# Patient Record
Sex: Female | Born: 1990 | Hispanic: Yes | Marital: Married | Smoking: Former smoker
Health system: Southern US, Community
[De-identification: ages and names within clinical notes are randomized; demographics above are authoritative.]

## PROBLEM LIST (undated history)

## (undated) ENCOUNTER — Inpatient Hospital Stay (HOSPITAL_COMMUNITY): Payer: Self-pay

## (undated) DIAGNOSIS — F32A Depression, unspecified: Secondary | ICD-10-CM

## (undated) DIAGNOSIS — D649 Anemia, unspecified: Secondary | ICD-10-CM

## (undated) HISTORY — PX: NO PAST SURGERIES: SHX2092

---

## 2012-01-14 ENCOUNTER — Emergency Department (HOSPITAL_COMMUNITY): Payer: Self-pay

## 2012-01-14 ENCOUNTER — Emergency Department (HOSPITAL_COMMUNITY)
Admission: EM | Admit: 2012-01-14 | Discharge: 2012-01-14 | Disposition: A | Discharge status: Home / Self Care | Payer: No Typology Code available for payment source | Attending: Emergency Medicine | Admitting: Emergency Medicine

## 2012-01-14 ENCOUNTER — Encounter (HOSPITAL_COMMUNITY): Payer: Self-pay

## 2012-01-14 DIAGNOSIS — R079 Chest pain, unspecified: Secondary | ICD-10-CM | POA: Insufficient documentation

## 2012-01-14 DIAGNOSIS — R51 Headache: Secondary | ICD-10-CM | POA: Insufficient documentation

## 2012-01-14 DIAGNOSIS — M542 Cervicalgia: Secondary | ICD-10-CM | POA: Insufficient documentation

## 2012-01-14 DIAGNOSIS — S139XXA Sprain of joints and ligaments of unspecified parts of neck, initial encounter: Secondary | ICD-10-CM | POA: Insufficient documentation

## 2012-01-14 DIAGNOSIS — M25519 Pain in unspecified shoulder: Secondary | ICD-10-CM | POA: Insufficient documentation

## 2012-01-14 DIAGNOSIS — N949 Unspecified condition associated with female genital organs and menstrual cycle: Secondary | ICD-10-CM | POA: Insufficient documentation

## 2012-01-14 DIAGNOSIS — M545 Low back pain, unspecified: Secondary | ICD-10-CM | POA: Insufficient documentation

## 2012-01-14 DIAGNOSIS — S161XXA Strain of muscle, fascia and tendon at neck level, initial encounter: Secondary | ICD-10-CM

## 2012-01-14 DIAGNOSIS — M546 Pain in thoracic spine: Secondary | ICD-10-CM | POA: Insufficient documentation

## 2012-01-14 MED ORDER — OXYCODONE-ACETAMINOPHEN 5-325 MG PO TABS: 1.0000 | ORAL_TABLET | Freq: Four times a day (QID) | ORAL | Status: AC | PRN

## 2012-01-14 MED ORDER — MORPHINE SULFATE 4 MG/ML IJ SOLN
4.0000 mg | Freq: Once | INTRAMUSCULAR | Status: AC
  Administered 2012-01-14: 4 mg via INTRAMUSCULAR
  Filled 2012-01-14: qty 1
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
## 2012-01-14 NOTE — ED Notes (Signed)
Family states she has not eaten at all today.
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
## 2015-07-24 ENCOUNTER — Other Ambulatory Visit (HOSPITAL_COMMUNITY)
Admission: RE | Admit: 2015-07-24 | Discharge: 2015-07-24 | Disposition: A | Discharge status: Home / Self Care | Payer: BLUE CROSS/BLUE SHIELD | Source: Ambulatory Visit | Attending: Obstetrics and Gynecology | Admitting: Obstetrics and Gynecology

## 2015-07-24 ENCOUNTER — Other Ambulatory Visit: Payer: Self-pay | Admitting: Obstetrics and Gynecology

## 2015-07-24 DIAGNOSIS — Z01419 Encounter for gynecological examination (general) (routine) without abnormal findings: Secondary | ICD-10-CM | POA: Diagnosis present

## 2015-07-24 DIAGNOSIS — Z113 Encounter for screening for infections with a predominantly sexual mode of transmission: Secondary | ICD-10-CM | POA: Insufficient documentation

## 2015-07-26 LAB — CYTOLOGY - PAP

## 2016-04-30 DIAGNOSIS — Z309 Encounter for contraceptive management, unspecified: Secondary | ICD-10-CM | POA: Diagnosis not present

## 2016-04-30 DIAGNOSIS — Z3202 Encounter for pregnancy test, result negative: Secondary | ICD-10-CM | POA: Diagnosis not present

## 2016-07-24 ENCOUNTER — Other Ambulatory Visit: Payer: Self-pay | Admitting: Obstetrics and Gynecology

## 2016-07-24 ENCOUNTER — Other Ambulatory Visit (HOSPITAL_COMMUNITY)
Admission: RE | Admit: 2016-07-24 | Discharge: 2016-07-24 | Disposition: A | Discharge status: Home / Self Care | Payer: BLUE CROSS/BLUE SHIELD | Source: Ambulatory Visit | Attending: Obstetrics and Gynecology | Admitting: Obstetrics and Gynecology

## 2016-07-24 DIAGNOSIS — Z01419 Encounter for gynecological examination (general) (routine) without abnormal findings: Secondary | ICD-10-CM | POA: Insufficient documentation

## 2016-07-26 LAB — CYTOLOGY - PAP

## 2016-09-16 DIAGNOSIS — L811 Chloasma: Secondary | ICD-10-CM | POA: Diagnosis not present

## 2016-10-23 DIAGNOSIS — Z309 Encounter for contraceptive management, unspecified: Secondary | ICD-10-CM | POA: Diagnosis not present

## 2016-11-26 DIAGNOSIS — Z3009 Encounter for other general counseling and advice on contraception: Secondary | ICD-10-CM | POA: Diagnosis not present

## 2016-11-26 DIAGNOSIS — N760 Acute vaginitis: Secondary | ICD-10-CM | POA: Diagnosis not present

## 2016-12-23 NOTE — L&D Delivery Note (Signed)
Delivery Note At 4:22 PM a viable female was delivered via Vaginal, Spontaneous (Presentation occiput anterior : ;  ).  APGAR: 9, 9; weight  .   Placenta status: ,intact 3 vesse  Cord:  with the following complications: None.  Cord pH: NA  Anesthesia:epidural    Episiotomy: None Lacerations: 2nd degree and Right labial  Suture Repair: 3.0 vicryl Est. Blood Loss (mL):  400 mL Uterine atony noted. Controlled with pitocin IV and 1000 mcg of cytotec placed rectally   Mom to postpartum.  Baby to Couplet care / Skin to Skin.  Muaad Boehning J. 12/12/2017, 4:46 PM

## 2017-01-14 DIAGNOSIS — N760 Acute vaginitis: Secondary | ICD-10-CM | POA: Diagnosis not present

## 2017-01-14 DIAGNOSIS — Z3169 Encounter for other general counseling and advice on procreation: Secondary | ICD-10-CM | POA: Diagnosis not present

## 2017-02-03 DIAGNOSIS — Z3200 Encounter for pregnancy test, result unknown: Secondary | ICD-10-CM | POA: Diagnosis not present

## 2017-04-14 ENCOUNTER — Ambulatory Visit (HOSPITAL_COMMUNITY)
Admission: EM | Admit: 2017-04-14 | Discharge: 2017-04-14 | Disposition: A | Discharge status: Home / Self Care | Payer: BLUE CROSS/BLUE SHIELD | Attending: Family Medicine | Admitting: Family Medicine

## 2017-04-14 ENCOUNTER — Encounter (HOSPITAL_COMMUNITY): Payer: Self-pay | Admitting: Emergency Medicine

## 2017-04-14 DIAGNOSIS — N926 Irregular menstruation, unspecified: Secondary | ICD-10-CM | POA: Diagnosis not present

## 2017-04-14 DIAGNOSIS — Z3201 Encounter for pregnancy test, result positive: Secondary | ICD-10-CM | POA: Diagnosis not present

## 2017-04-14 DIAGNOSIS — Z3490 Encounter for supervision of normal pregnancy, unspecified, unspecified trimester: Secondary | ICD-10-CM

## 2017-04-14 DIAGNOSIS — N912 Amenorrhea, unspecified: Secondary | ICD-10-CM

## 2017-04-14 LAB — POCT PREGNANCY, URINE: PREG TEST UR: POSITIVE — AB

## 2017-04-14 NOTE — ED Triage Notes (Signed)
The patient presented to the [facility] with a complaint of missing her period and requesting a pregnancy test. The patient reported a positive home pregnancy test 3 days ago.

## 2017-04-14 NOTE — Discharge Instructions (Signed)
You are pregnant..  Follow up with your doctor.

## 2017-04-14 NOTE — ED Provider Notes (Signed)
[facility]    CSN: [id number] Arrival date & time: 04/14/17  1724     History   Chief Complaint Chief Complaint  Patient presents with  . Possible Pregnancy    HPI Brittney Olson is a 26 y.o. female.   The patient presented to the [facility] with a complaint of missing her period and requesting a pregnancy test. The patient reported a positive home pregnancy test 3 days ago.  Last menstrual period was March 20.  Patient is G0P0      History reviewed. No pertinent past medical history.  There are no active problems to display for this patient.   History reviewed. No pertinent surgical history.  OB History    No data available       Home Medications    Prior to Admission medications   Not on File    Family History History reviewed. No pertinent family history.  Social History Social History  Substance Use Topics  . Smoking status: Never Smoker  . Smokeless tobacco: Not on file  . Alcohol use No     Allergies   Patient has no known allergies.   Review of Systems Review of Systems  All other systems reviewed and are negative.    Physical Exam Triage Vital Signs ED Triage Vitals  Enc Vitals Group     BP 04/14/17 1741 109/71     Pulse Rate 04/14/17 1741 80     Resp 04/14/17 1741 16     Temp 04/14/17 1741 97.7 F (36.5 C)     Temp Source 04/14/17 1741 Oral     SpO2 04/14/17 1741 95 %     Weight --      Height --      Head Circumference --      Peak Flow --      Pain Score 04/14/17 1740 0     Pain Loc --      Pain Edu? --      Excl. in GC? --    No data found.   Updated Vital Signs BP 109/71 (BP Location: Right Arm)   Pulse 80   Temp 97.7 F (36.5 C) (Oral)   Resp 16   LMP 03/11/2017 Comment: Needs pregnancy test  SpO2 95%    Physical Exam  Constitutional: She is oriented to person, place, and time. She appears well-developed and well-nourished.  HENT:  Right Ear: External ear normal.  Left Ear: External  ear normal.  Mouth/Throat: Oropharynx is clear and moist.  Eyes: Conjunctivae and EOM are normal. Pupils are equal, round, and reactive to light.  Neck: Normal range of motion. Neck supple.  Pulmonary/Chest: Effort normal.  Musculoskeletal: Normal range of motion.  Neurological: She is alert and oriented to person, place, and time.  Skin: Skin is warm and dry.  Nursing note and vitals reviewed.    UC Treatments / Results  Labs (all labs ordered are listed, but only abnormal results are displayed) Labs Reviewed  POCT PREGNANCY, URINE    EKG  EKG Interpretation None       Radiology No results found.  Procedures Procedures (including critical care time)  Medications Ordered in UC Medications - No data to display   Initial Impression / Assessment and Plan / UC Course  I have reviewed the triage vital signs and the nursing notes.  Pertinent labs & imaging results that were available during my care of the patient were reviewed by me and considered in my medical decision  making (see chart for details).      Final Clinical Impressions(s) / UC Diagnoses   Final diagnoses:  Amenorrhea  Pregnancy, unspecified gestational age    [facility] Prescriptions New Prescriptions   No medications on file     Elvina Sidle, MD 04/14/17 1811

## 2017-05-01 DIAGNOSIS — Z3401 Encounter for supervision of normal first pregnancy, first trimester: Secondary | ICD-10-CM | POA: Diagnosis not present

## 2017-05-01 DIAGNOSIS — Z3400 Encounter for supervision of normal first pregnancy, unspecified trimester: Secondary | ICD-10-CM | POA: Diagnosis not present

## 2017-05-01 LAB — OB RESULTS CONSOLE GC/CHLAMYDIA
CHLAMYDIA, DNA PROBE: NEGATIVE
Gonorrhea: NEGATIVE

## 2017-05-01 LAB — OB RESULTS CONSOLE HIV ANTIBODY (ROUTINE TESTING): HIV: NONREACTIVE

## 2017-05-01 LAB — OB RESULTS CONSOLE ABO/RH: RH TYPE: POSITIVE

## 2017-05-01 LAB — OB RESULTS CONSOLE RPR: RPR: NONREACTIVE

## 2017-05-01 LAB — OB RESULTS CONSOLE HEPATITIS B SURFACE ANTIGEN: Hepatitis B Surface Ag: NEGATIVE

## 2017-05-01 LAB — OB RESULTS CONSOLE RUBELLA ANTIBODY, IGM: Rubella: IMMUNE

## 2017-06-11 DIAGNOSIS — Z3401 Encounter for supervision of normal first pregnancy, first trimester: Secondary | ICD-10-CM | POA: Diagnosis not present

## 2017-06-11 DIAGNOSIS — Z3400 Encounter for supervision of normal first pregnancy, unspecified trimester: Secondary | ICD-10-CM | POA: Diagnosis not present

## 2017-07-22 DIAGNOSIS — R300 Dysuria: Secondary | ICD-10-CM | POA: Diagnosis not present

## 2017-07-23 DIAGNOSIS — Z3402 Encounter for supervision of normal first pregnancy, second trimester: Secondary | ICD-10-CM | POA: Diagnosis not present

## 2017-07-23 DIAGNOSIS — Z3A19 19 weeks gestation of pregnancy: Secondary | ICD-10-CM | POA: Diagnosis not present

## 2017-07-23 DIAGNOSIS — Z36 Encounter for antenatal screening for chromosomal anomalies: Secondary | ICD-10-CM | POA: Diagnosis not present

## 2017-07-29 DIAGNOSIS — Z3402 Encounter for supervision of normal first pregnancy, second trimester: Secondary | ICD-10-CM | POA: Diagnosis not present

## 2017-09-24 DIAGNOSIS — Z3403 Encounter for supervision of normal first pregnancy, third trimester: Secondary | ICD-10-CM | POA: Diagnosis not present

## 2017-09-24 DIAGNOSIS — Z3402 Encounter for supervision of normal first pregnancy, second trimester: Secondary | ICD-10-CM | POA: Diagnosis not present

## 2017-10-02 ENCOUNTER — Inpatient Hospital Stay (HOSPITAL_COMMUNITY)
Admission: AD | Admit: 2017-10-02 | Discharge: 2017-10-02 | Disposition: A | Discharge status: Home / Self Care | Payer: BLUE CROSS/BLUE SHIELD | Source: Ambulatory Visit | Attending: Obstetrics and Gynecology | Admitting: Obstetrics and Gynecology

## 2017-10-02 ENCOUNTER — Encounter: Payer: Self-pay | Admitting: Student

## 2017-10-02 ENCOUNTER — Inpatient Hospital Stay (HOSPITAL_COMMUNITY): Payer: BLUE CROSS/BLUE SHIELD

## 2017-10-02 DIAGNOSIS — O4700 False labor before 37 completed weeks of gestation, unspecified trimester: Secondary | ICD-10-CM | POA: Diagnosis not present

## 2017-10-02 DIAGNOSIS — Z3A29 29 weeks gestation of pregnancy: Secondary | ICD-10-CM | POA: Insufficient documentation

## 2017-10-02 DIAGNOSIS — O368130 Decreased fetal movements, third trimester, not applicable or unspecified: Secondary | ICD-10-CM | POA: Diagnosis not present

## 2017-10-02 DIAGNOSIS — O479 False labor, unspecified: Secondary | ICD-10-CM | POA: Diagnosis not present

## 2017-10-02 DIAGNOSIS — Z3689 Encounter for other specified antenatal screening: Secondary | ICD-10-CM

## 2017-10-02 DIAGNOSIS — Z87891 Personal history of nicotine dependence: Secondary | ICD-10-CM | POA: Diagnosis not present

## 2017-10-02 HISTORY — DX: Anemia, unspecified: D64.9

## 2017-10-02 LAB — URINALYSIS, ROUTINE W REFLEX MICROSCOPIC
Bilirubin Urine: NEGATIVE
Glucose, UA: NEGATIVE mg/dL
Hgb urine dipstick: NEGATIVE
Ketones, ur: NEGATIVE mg/dL
Nitrite: NEGATIVE
PROTEIN: NEGATIVE mg/dL
Specific Gravity, Urine: 1.008 (ref 1.005–1.030)
pH: 8 (ref 5.0–8.0)

## 2017-10-02 LAB — FETAL FIBRONECTIN: FETAL FIBRONECTIN: NEGATIVE

## 2017-10-02 MED ORDER — LACTATED RINGERS IV BOLUS (SEPSIS)
1000.0000 mL | Freq: Once | INTRAVENOUS | Status: AC
  Administered 2017-10-02: 1000 mL via INTRAVENOUS

## 2017-10-02 NOTE — Discharge Instructions (Signed)
Preterm Labor and Birth Information °The normal length of a pregnancy is 39-41 weeks. Preterm labor is when labor starts before 37 completed weeks of pregnancy. °What are the risk factors for preterm labor? °Preterm labor is more likely to occur in women who: °· Have certain infections during pregnancy such as a bladder infection, sexually transmitted infection, or infection inside the uterus (chorioamnionitis). °· Have a shorter-than-normal cervix. °· Have gone into preterm labor before. °· Have had surgery on their cervix. °· Are younger than age 17 or older than age 35. °· Are African American. °· Are pregnant with twins or multiple babies (multiple gestation). °· Take street drugs or smoke while pregnant. °· Do not gain enough weight while pregnant. °· Became pregnant shortly after having been pregnant. ° °What are the symptoms of preterm labor? °Symptoms of preterm labor include: °· Cramps similar to those that can happen during a menstrual period. The cramps may happen with diarrhea. °· Pain in the abdomen or lower back. °· Regular uterine contractions that may feel like tightening of the abdomen. °· A feeling of increased pressure in the pelvis. °· Increased watery or bloody mucus discharge from the vagina. °· Water breaking (ruptured amniotic sac). ° °Why is it important to recognize signs of preterm labor? °It is important to recognize signs of preterm labor because babies who are born prematurely may not be fully developed. This can put them at an increased risk for: °· Long-term (chronic) heart and lung problems. °· Difficulty immediately after birth with regulating body systems, including blood sugar, body temperature, heart rate, and breathing rate. °· Bleeding in the brain. °· Cerebral palsy. °· Learning difficulties. °· Death. ° °These risks are highest for babies who are born before 34 weeks of pregnancy. °How is preterm labor treated? °Treatment depends on the length of your pregnancy, your  condition, and the health of your baby. It may involve: °· Having a stitch (suture) placed in your cervix to prevent your cervix from opening too early (cerclage). °· Taking or being given medicines, such as: °? Hormone medicines. These may be given early in pregnancy to help support the pregnancy. °? Medicine to stop contractions. °? Medicines to help mature the baby’s lungs. These may be prescribed if the risk of delivery is high. °? Medicines to prevent your baby from developing cerebral palsy. ° °If the labor happens before 34 weeks of pregnancy, you may need to stay in the hospital. °What should I do if I think I am in preterm labor? °If you think that you are going into preterm labor, call your health care provider right away. °How can I prevent preterm labor in future pregnancies? °To increase your chance of having a full-term pregnancy: °· Do not use any tobacco products, such as cigarettes, chewing tobacco, and e-cigarettes. If you need help quitting, ask your health care provider. °· Do not use street drugs or medicines that have not been prescribed to you during your pregnancy. °· Talk with your health care provider before taking any herbal supplements, even if you have been taking them regularly. °· Make sure you gain a healthy amount of weight during your pregnancy. °· Watch for infection. If you think that you might have an infection, get it checked right away. °· Make sure to tell your health care provider if you have gone into preterm labor before. ° °This information is not intended to replace advice given to you by your health care provider. Make sure you discuss any questions   you have with your health care provider. °Document Released: 02/29/2004 Document Revised: 05/21/2016 Document Reviewed: 05/01/2016 °Elsevier Interactive Patient Education © 2018 [employer] ° ° ° ° °Fetal Movement Counts °Patient Name: ________________________________________________ Patient Due Date:  ____________________ °What is a fetal movement count? °A fetal movement count is the number of times that you feel your baby move during a certain amount of time. This may also be called a fetal kick count. A fetal movement count is recommended for every pregnant woman. You may be asked to start counting fetal movements as early as week 28 of your pregnancy. °Pay attention to when your baby is most active. You may notice your baby's sleep and wake cycles. You may also notice things that make your baby move more. You should do a fetal movement count: °· When your baby is normally most active. °· At the same time each day. ° °A good time to count movements is while you are resting, after having something to eat and drink. °How do I count fetal movements? °1. Find a quiet, comfortable area. Sit, or lie down on your side. °2. Write down the date, the start time and stop time, and the number of movements that you felt between those two times. Take this information with you to your health care visits. °3. For 2 hours, count kicks, flutters, swishes, rolls, and jabs. You should feel at least 10 movements during 2 hours. °4. You may stop counting after you have felt 10 movements. °5. If you do not feel 10 movements in 2 hours, have something to eat and drink. Then, keep resting and counting for 1 hour. If you feel at least 4 movements during that hour, you may stop counting. °Contact a health care provider if: °· You feel fewer than 4 movements in 2 hours. °· Your baby is not moving like he or she usually does. °Date: ____________ Start time: ____________ Stop time: ____________ Movements: ____________ °Date: ____________ Start time: ____________ Stop time: ____________ Movements: ____________ °Date: ____________ Start time: ____________ Stop time: ____________ Movements: ____________ °Date: ____________ Start time: ____________ Stop time: ____________ Movements: ____________ °Date: ____________ Start time: ____________  Stop time: ____________ Movements: ____________ °Date: ____________ Start time: ____________ Stop time: ____________ Movements: ____________ °Date: ____________ Start time: ____________ Stop time: ____________ Movements: ____________ °Date: ____________ Start time: ____________ Stop time: ____________ Movements: ____________ °Date: ____________ Start time: ____________ Stop time: ____________ Movements: ____________ °This information is not intended to replace advice given to you by your health care provider. Make sure you discuss any questions you have with your health care provider. °Document Released: 01/08/2007 Document Revised: 08/07/2016 Document Reviewed: 01/18/2016 °Elsevier Interactive Patient Education © 2018 [employer] Inc[employer] ° °

## 2017-10-02 NOTE — MAU Provider Note (Signed)
History     CSN: [id number]  Arrival date and time: 10/02/17 1410   First Provider Initiated Contact with Patient 10/02/17 1455      Chief Complaint  Patient presents with  . Decreased Fetal Movement   HPI Brittney Olson is a 26 y.o. G1P0 at [redacted]w[redacted]d who presents with decreased fetal movement since yesterday. Reports only feeling baby move 4 times today prior to coming to [facility]. Since arriving to [facility] has felt baby move more. Denies abdominal pain, vaginal bleeding, or LOF. Denies complications with this pregnancy.   OB History    Gravida Para Term Preterm AB Living   1             SAB TAB Ectopic Multiple Live Births                  Past Medical History:  Diagnosis Date  . Anemia     History reviewed. No pertinent surgical history.  Family History  Problem Relation Age of Onset  . Cancer Maternal Grandfather     Social History  Substance Use Topics  . Smoking status: Former Smoker    Quit date: 10/02/2013  . Smokeless tobacco: Never Used     Comment: smoked socially   . Alcohol use No    Allergies: No Known Allergies  No prescriptions prior to admission.    Review of Systems  Constitutional: Negative.   Gastrointestinal: Negative.   Genitourinary: Negative.    Physical Exam   Blood pressure 104/67, pulse 78, temperature 98.4 F (36.9 C), temperature source Oral, resp. rate 18, weight 130 lb (59 kg), last menstrual period 03/11/2017, SpO2 97 %.  Physical Exam  Nursing note and vitals reviewed. Constitutional: She is oriented to person, place, and time. She appears well-developed and well-nourished. No distress.  HENT:  Head: Normocephalic and atraumatic.  Eyes: Conjunctivae are normal. Right eye exhibits no discharge. Left eye exhibits no discharge. No scleral icterus.  Neck: Normal range of motion.  Cardiovascular:  No murmur heard. Respiratory: Effort normal. No respiratory distress.  GI: Soft. There is no tenderness.  Genitourinary:   Genitourinary Comments: Dilation: Closed Effacement (%): Thick Cervical Position: Middle Station: -3 Exam by:: Judeth Horn NP   Neurological: She is alert and oriented to person, place, and time.  Skin: Skin is warm and dry. She is not diaphoretic.  Psychiatric: She has a normal mood and affect. Her behavior is normal. Judgment and thought content normal.   Fetal Tracing:  Baseline: 145 Variability: moderate Accelerations: 15x15 Decelerations: none  Toco: Q2-5 min & some UI -- improved with IV fluids MAU Course  Procedures Results for orders placed or performed during the hospital encounter of 10/02/17 (from the past 24 hour(s))  Fetal fibronectin     Status: None   Collection Time: 10/02/17  3:28 PM  Result Value Ref Range   Fetal Fibronectin NEGATIVE NEGATIVE  Urinalysis, Routine w reflex microscopic     Status: Abnormal   Collection Time: 10/02/17  3:31 PM  Result Value Ref Range   Color, Urine YELLOW YELLOW   APPearance HAZY (A) CLEAR   Specific Gravity, Urine 1.008 1.005 - 1.030   pH 8.0 5.0 - 8.0   Glucose, UA NEGATIVE NEGATIVE mg/dL   Hgb urine dipstick NEGATIVE NEGATIVE   Bilirubin Urine NEGATIVE NEGATIVE   Ketones, ur NEGATIVE NEGATIVE mg/dL   Protein, ur NEGATIVE NEGATIVE mg/dL   Nitrite NEGATIVE NEGATIVE   Leukocytes, UA TRACE (A) NEGATIVE   RBC /  HPF 0-5 0 - 5 RBC/hpf   WBC, UA 0-5 0 - 5 WBC/hpf   Bacteria, UA RARE (A) NONE SEEN   Squamous Epithelial / LPF 0-5 (A) NONE SEEN   Mucus PRESENT    No results found.  MDM Reactive NST. Fetal movement audible in room & pt reports return of perceived movement.  Ctx every 3-5 minutes. Palpate mild. Patient denies pain; reports feeling tightening. Cervix closed/thick/high. FFN negative.  IV fluid bolus given & ultrasound ordered for cervical length per Dr. Dion Body.  TVUS -- CL 4.1 cm C/w Dr. Dion Body. Discussed results of FFN, ultrasound, & resolution of ctx. Ok to discharge home.   Assessment and Plan   A:  1. Decreased fetal movements in third trimester, single or unspecified fetus   2. Preterm contractions   3. [redacted] weeks gestation of pregnancy   4. NST (non-stress test) reactive    P: Discharge home Note for work today & tomorrow Increase water intake Discussed reasons to return to [facility]U Keep f/u with OB  Judeth Horn 10/02/2017, 7:17 PM

## 2017-10-02 NOTE — MAU Note (Signed)
Pt states that her baby is usually very active throughout the day. However, she noticed that last night and this morning that the baby was not moving. She states that she tried drinking, eating, and regular coffee today to see if she could get her baby to move. She reports that none of these interventions made her baby move. She completed a kick count at home starting at 1300 and had 4 movements in one hour.   She reports no bleeding, leaking fluid, or pain.

## 2017-10-09 DIAGNOSIS — Z23 Encounter for immunization: Secondary | ICD-10-CM | POA: Diagnosis not present

## 2017-10-09 DIAGNOSIS — N898 Other specified noninflammatory disorders of vagina: Secondary | ICD-10-CM | POA: Diagnosis not present

## 2017-10-23 DIAGNOSIS — O2613 Low weight gain in pregnancy, third trimester: Secondary | ICD-10-CM | POA: Diagnosis not present

## 2017-11-06 DIAGNOSIS — R800 Isolated proteinuria: Secondary | ICD-10-CM | POA: Diagnosis not present

## 2017-11-06 DIAGNOSIS — Z3403 Encounter for supervision of normal first pregnancy, third trimester: Secondary | ICD-10-CM | POA: Diagnosis not present

## 2017-11-25 DIAGNOSIS — Z3403 Encounter for supervision of normal first pregnancy, third trimester: Secondary | ICD-10-CM | POA: Diagnosis not present

## 2017-11-25 LAB — OB RESULTS CONSOLE GBS: STREP GROUP B AG: NEGATIVE

## 2017-12-11 ENCOUNTER — Encounter (HOSPITAL_COMMUNITY): Payer: Self-pay | Admitting: *Deleted

## 2017-12-11 ENCOUNTER — Inpatient Hospital Stay (HOSPITAL_COMMUNITY)
Admission: AD | Admit: 2017-12-11 | Discharge: 2017-12-11 | Disposition: A | Discharge status: Home / Self Care | Payer: BLUE CROSS/BLUE SHIELD | Source: Ambulatory Visit | Attending: Obstetrics and Gynecology | Admitting: Obstetrics and Gynecology

## 2017-12-11 DIAGNOSIS — D649 Anemia, unspecified: Secondary | ICD-10-CM | POA: Diagnosis not present

## 2017-12-11 DIAGNOSIS — Z8279 Family history of other congenital malformations, deformations and chromosomal abnormalities: Secondary | ICD-10-CM | POA: Diagnosis not present

## 2017-12-11 DIAGNOSIS — O471 False labor at or after 37 completed weeks of gestation: Secondary | ICD-10-CM

## 2017-12-11 DIAGNOSIS — Z3A39 39 weeks gestation of pregnancy: Secondary | ICD-10-CM | POA: Diagnosis not present

## 2017-12-11 DIAGNOSIS — Z87891 Personal history of nicotine dependence: Secondary | ICD-10-CM | POA: Diagnosis not present

## 2017-12-11 DIAGNOSIS — O9902 Anemia complicating childbirth: Secondary | ICD-10-CM | POA: Diagnosis not present

## 2017-12-11 DIAGNOSIS — Z23 Encounter for immunization: Secondary | ICD-10-CM | POA: Diagnosis not present

## 2017-12-11 DIAGNOSIS — O411230 Chorioamnionitis, third trimester, not applicable or unspecified: Secondary | ICD-10-CM | POA: Diagnosis not present

## 2017-12-11 DIAGNOSIS — Z051 Observation and evaluation of newborn for suspected infectious condition ruled out: Secondary | ICD-10-CM | POA: Diagnosis not present

## 2017-12-11 DIAGNOSIS — Z3483 Encounter for supervision of other normal pregnancy, third trimester: Secondary | ICD-10-CM | POA: Diagnosis not present

## 2017-12-11 NOTE — MAU Note (Signed)
1322 dr Charlotta Newtonozan notified of pt complaint of vaginal bleeding and contractions. Notified pt is having irregular contractions, SVE 1/50/-2/vtx with brown mucous discharge on examiner's glove. No bleeding on perineum, reactive EFM tracing , - GBS. EFM tracing reviewed per provider.

## 2017-12-11 NOTE — MAU Note (Signed)
Pt reports contractions and vaginal bleeding since last pm.

## 2017-12-12 ENCOUNTER — Encounter (HOSPITAL_COMMUNITY): Payer: Self-pay

## 2017-12-12 ENCOUNTER — Inpatient Hospital Stay (HOSPITAL_COMMUNITY): Payer: BLUE CROSS/BLUE SHIELD | Admitting: Anesthesiology

## 2017-12-12 ENCOUNTER — Inpatient Hospital Stay (HOSPITAL_COMMUNITY)
Admission: AD | Admit: 2017-12-12 | Discharge: 2017-12-14 | DRG: 807 | Disposition: A | Discharge status: Home / Self Care | Payer: BLUE CROSS/BLUE SHIELD | Source: Ambulatory Visit | Attending: Obstetrics and Gynecology | Admitting: Obstetrics and Gynecology

## 2017-12-12 DIAGNOSIS — Z87891 Personal history of nicotine dependence: Secondary | ICD-10-CM | POA: Diagnosis not present

## 2017-12-12 DIAGNOSIS — D649 Anemia, unspecified: Secondary | ICD-10-CM | POA: Diagnosis not present

## 2017-12-12 DIAGNOSIS — Z3A39 39 weeks gestation of pregnancy: Secondary | ICD-10-CM

## 2017-12-12 DIAGNOSIS — O411230 Chorioamnionitis, third trimester, not applicable or unspecified: Principal | ICD-10-CM | POA: Diagnosis present

## 2017-12-12 DIAGNOSIS — Z8279 Family history of other congenital malformations, deformations and chromosomal abnormalities: Secondary | ICD-10-CM | POA: Diagnosis not present

## 2017-12-12 DIAGNOSIS — O9902 Anemia complicating childbirth: Secondary | ICD-10-CM | POA: Diagnosis not present

## 2017-12-12 DIAGNOSIS — Z051 Observation and evaluation of newborn for suspected infectious condition ruled out: Secondary | ICD-10-CM | POA: Diagnosis not present

## 2017-12-12 DIAGNOSIS — Z3483 Encounter for supervision of other normal pregnancy, third trimester: Secondary | ICD-10-CM | POA: Diagnosis present

## 2017-12-12 LAB — CBC
HCT: 37.4 % (ref 36.0–46.0)
HEMOGLOBIN: 12.6 g/dL (ref 12.0–15.0)
MCH: 27.2 pg (ref 26.0–34.0)
MCHC: 33.7 g/dL (ref 30.0–36.0)
MCV: 80.6 fL (ref 78.0–100.0)
PLATELETS: 299 10*3/uL (ref 150–400)
RBC: 4.64 MIL/uL (ref 3.87–5.11)
RDW: 14.1 % (ref 11.5–15.5)
WBC: 15.8 10*3/uL — AB (ref 4.0–10.5)

## 2017-12-12 LAB — TYPE AND SCREEN
ABO/RH(D): B POS
ANTIBODY SCREEN: NEGATIVE

## 2017-12-12 LAB — RPR: RPR: NONREACTIVE

## 2017-12-12 LAB — ABO/RH: ABO/RH(D): B POS

## 2017-12-12 MED ORDER — SIMETHICONE 80 MG PO CHEW: 80.0000 mg | CHEWABLE_TABLET | ORAL | Status: DC | PRN

## 2017-12-12 MED ORDER — SODIUM CHLORIDE 0.9 % IV SOLN
3.0000 g | Freq: Four times a day (QID) | INTRAVENOUS | Status: AC
  Administered 2017-12-12 – 2017-12-13 (×4): 3 g via INTRAVENOUS
  Filled 2017-12-12 (×4): qty 3

## 2017-12-12 MED ORDER — BENZOCAINE-MENTHOL 20-0.5 % EX AERO: 1.0000 "application " | INHALATION_SPRAY | CUTANEOUS | Status: DC | PRN

## 2017-12-12 MED ORDER — ZOLPIDEM TARTRATE 5 MG PO TABS
5.0000 mg | ORAL_TABLET | Freq: Every evening | ORAL | Status: DC | PRN
Start: 2017-12-12 — End: 2017-12-14

## 2017-12-12 MED ORDER — DIPHENHYDRAMINE HCL 25 MG PO CAPS: 25.0000 mg | ORAL_CAPSULE | Freq: Four times a day (QID) | ORAL | Status: DC | PRN

## 2017-12-12 MED ORDER — WITCH HAZEL-GLYCERIN EX PADS: 1.0000 "application " | MEDICATED_PAD | CUTANEOUS | Status: DC | PRN

## 2017-12-12 MED ORDER — TERBUTALINE SULFATE 1 MG/ML IJ SOLN
0.2500 mg | Freq: Once | INTRAMUSCULAR | Status: DC | PRN
  Filled 2017-12-12: qty 1

## 2017-12-12 MED ORDER — METHYLERGONOVINE MALEATE 0.2 MG PO TABS: 0.2000 mg | ORAL_TABLET | ORAL | Status: DC | PRN

## 2017-12-12 MED ORDER — IBUPROFEN 600 MG PO TABS
600.0000 mg | ORAL_TABLET | Freq: Four times a day (QID) | ORAL | Status: DC
  Administered 2017-12-12 – 2017-12-14 (×8): 600 mg via ORAL
  Filled 2017-12-12 (×6): qty 1

## 2017-12-12 MED ORDER — LIDOCAINE HCL (PF) 1 % IJ SOLN
INTRAMUSCULAR | Status: DC | PRN
  Administered 2017-12-12 (×2): 5 mL

## 2017-12-12 MED ORDER — LACTATED RINGERS IV SOLN
500.0000 mL | Freq: Once | INTRAVENOUS | Status: AC
  Administered 2017-12-12: 500 mL via INTRAVENOUS

## 2017-12-12 MED ORDER — ONDANSETRON HCL 4 MG/2ML IJ SOLN: 4.0000 mg | INTRAMUSCULAR | Status: DC | PRN

## 2017-12-12 MED ORDER — LIDOCAINE HCL (PF) 1 % IJ SOLN
30.0000 mL | INTRAMUSCULAR | Status: DC | PRN
  Filled 2017-12-12: qty 30

## 2017-12-12 MED ORDER — PRENATAL MULTIVITAMIN CH
1.0000 | ORAL_TABLET | Freq: Every day | ORAL | Status: DC
  Administered 2017-12-13 – 2017-12-14 (×2): 1 via ORAL
  Filled 2017-12-12 (×2): qty 1

## 2017-12-12 MED ORDER — FERROUS SULFATE 325 (65 FE) MG PO TABS
325.0000 mg | ORAL_TABLET | Freq: Two times a day (BID) | ORAL | Status: DC
  Administered 2017-12-13 (×2): 325 mg via ORAL
  Filled 2017-12-12 (×2): qty 1

## 2017-12-12 MED ORDER — EPHEDRINE 5 MG/ML INJ
10.0000 mg | INTRAVENOUS | Status: DC | PRN
  Filled 2017-12-12: qty 2

## 2017-12-12 MED ORDER — OXYCODONE HCL 5 MG PO TABS: 5.0000 mg | ORAL_TABLET | ORAL | Status: DC | PRN

## 2017-12-12 MED ORDER — SOD CITRATE-CITRIC ACID 500-334 MG/5ML PO SOLN: 30.0000 mL | ORAL | Status: DC | PRN

## 2017-12-12 MED ORDER — LACTATED RINGERS IV SOLN: 500.0000 mL | INTRAVENOUS | Status: DC | PRN

## 2017-12-12 MED ORDER — METHYLERGONOVINE MALEATE 0.2 MG/ML IJ SOLN
INTRAMUSCULAR | Status: AC
  Filled 2017-12-12: qty 1

## 2017-12-12 MED ORDER — FLEET ENEMA 7-19 GM/118ML RE ENEM: 1.0000 | ENEMA | RECTAL | Status: DC | PRN

## 2017-12-12 MED ORDER — MISOPROSTOL 200 MCG PO TABS
ORAL_TABLET | ORAL | Status: AC
  Administered 2017-12-12: 1000 ug
  Filled 2017-12-12: qty 5

## 2017-12-12 MED ORDER — COCONUT OIL OIL
1.0000 "application " | TOPICAL_OIL | Status: DC | PRN
  Filled 2017-12-12: qty 120

## 2017-12-12 MED ORDER — ACETAMINOPHEN 325 MG PO TABS: 650.0000 mg | ORAL_TABLET | ORAL | Status: DC | PRN

## 2017-12-12 MED ORDER — LACTATED RINGERS IV SOLN: 500.0000 mL | Freq: Once | INTRAVENOUS | Status: DC

## 2017-12-12 MED ORDER — DIBUCAINE 1 % RE OINT: 1.0000 "application " | TOPICAL_OINTMENT | RECTAL | Status: DC | PRN

## 2017-12-12 MED ORDER — PHENYLEPHRINE 40 MCG/ML (10ML) SYRINGE FOR IV PUSH (FOR BLOOD PRESSURE SUPPORT)
80.0000 ug | PREFILLED_SYRINGE | INTRAVENOUS | Status: DC | PRN
  Filled 2017-12-12: qty 5

## 2017-12-12 MED ORDER — METHYLERGONOVINE MALEATE 0.2 MG/ML IJ SOLN: 0.2000 mg | INTRAMUSCULAR | Status: DC | PRN

## 2017-12-12 MED ORDER — OXYTOCIN 40 UNITS IN LACTATED RINGERS INFUSION - SIMPLE MED
1.0000 m[IU]/min | INTRAVENOUS | Status: DC
  Administered 2017-12-12: 2 m[IU]/min via INTRAVENOUS

## 2017-12-12 MED ORDER — FENTANYL 2.5 MCG/ML BUPIVACAINE 1/10 % EPIDURAL INFUSION (WH - ANES)
14.0000 mL/h | INTRAMUSCULAR | Status: DC | PRN
Start: 2017-12-12 — End: 2017-12-12
  Administered 2017-12-12 (×2): 14 mL/h via EPIDURAL
  Filled 2017-12-12 (×2): qty 100

## 2017-12-12 MED ORDER — MISOPROSTOL 200 MCG PO TABS: 1000.0000 ug | ORAL_TABLET | Freq: Once | ORAL | Status: DC

## 2017-12-12 MED ORDER — OXYTOCIN BOLUS FROM INFUSION
500.0000 mL | Freq: Once | INTRAVENOUS | Status: AC
  Administered 2017-12-12: 500 mL via INTRAVENOUS

## 2017-12-12 MED ORDER — ONDANSETRON HCL 4 MG/2ML IJ SOLN: 4.0000 mg | Freq: Four times a day (QID) | INTRAMUSCULAR | Status: DC | PRN

## 2017-12-12 MED ORDER — OXYCODONE-ACETAMINOPHEN 5-325 MG PO TABS: 2.0000 | ORAL_TABLET | ORAL | Status: DC | PRN

## 2017-12-12 MED ORDER — SENNOSIDES-DOCUSATE SODIUM 8.6-50 MG PO TABS
2.0000 | ORAL_TABLET | ORAL | Status: DC
  Administered 2017-12-12 – 2017-12-13 (×2): 2 via ORAL
  Filled 2017-12-12: qty 2

## 2017-12-12 MED ORDER — PHENYLEPHRINE 40 MCG/ML (10ML) SYRINGE FOR IV PUSH (FOR BLOOD PRESSURE SUPPORT)
80.0000 ug | PREFILLED_SYRINGE | INTRAVENOUS | Status: DC | PRN
  Filled 2017-12-12 (×2): qty 10
  Filled 2017-12-12: qty 5

## 2017-12-12 MED ORDER — OXYTOCIN 40 UNITS IN LACTATED RINGERS INFUSION - SIMPLE MED
2.5000 [IU]/h | INTRAVENOUS | Status: DC
  Filled 2017-12-12: qty 1000

## 2017-12-12 MED ORDER — ONDANSETRON HCL 4 MG PO TABS
4.0000 mg | ORAL_TABLET | ORAL | Status: DC | PRN
Start: 2017-12-12 — End: 2017-12-14

## 2017-12-12 MED ORDER — LACTATED RINGERS IV SOLN
INTRAVENOUS | Status: DC
  Administered 2017-12-12: 125 mL/h via INTRAVENOUS
  Administered 2017-12-12: 07:00:00 via INTRAVENOUS

## 2017-12-12 MED ORDER — DIPHENHYDRAMINE HCL 50 MG/ML IJ SOLN: 12.5000 mg | INTRAMUSCULAR | Status: DC | PRN

## 2017-12-12 MED ORDER — OXYCODONE-ACETAMINOPHEN 5-325 MG PO TABS: 1.0000 | ORAL_TABLET | ORAL | Status: DC | PRN

## 2017-12-12 NOTE — Anesthesia Preprocedure Evaluation (Addendum)
Anesthesia Evaluation  Patient identified by MRN, date of birth, ID band Patient awake    Reviewed: Allergy & Precautions, NPO status , Patient's Chart, lab work & pertinent test results  Airway Mallampati: II  TM Distance: >3 FB Neck ROM: Full    Dental no notable dental hx. (+) Teeth Intact   Pulmonary former smoker,    Pulmonary exam normal breath sounds clear to auscultation       Cardiovascular negative cardio ROS Normal cardiovascular exam Rhythm:Regular Rate:Normal     Neuro/Psych negative neurological ROS  negative psych ROS   GI/Hepatic negative GI ROS, Neg liver ROS,   Endo/Other  negative endocrine ROS  Renal/GU negative Renal ROS  negative genitourinary   Musculoskeletal negative musculoskeletal ROS (+)   Abdominal   Peds  Hematology  (+) anemia ,   Anesthesia Other Findings   Reproductive/Obstetrics                             Anesthesia Physical Anesthesia Plan  ASA: II  Anesthesia Plan: Epidural   Post-op Pain Management:    Induction:   PONV Risk Score and Plan:   Airway Management Planned:   Additional Equipment:   Intra-op Plan:   Post-operative Plan:   Informed Consent: I have reviewed the patients History and Physical, chart, labs and discussed the procedure including the risks, benefits and alternatives for the proposed anesthesia with the patient or authorized representative who has indicated his/her understanding and acceptance.     Plan Discussed with: Anesthesiologist  Anesthesia Plan Comments:         Anesthesia Quick Evaluation

## 2017-12-12 NOTE — Anesthesia Pain Management Evaluation Note (Signed)
  CRNA Pain Management Visit Note  Patient: Brittney Olson, 26 y.o., female  "Hello I am a [facility] of the anesthesia [facility]. We have an anesthesia team available at all times to provide care throughout the hospital, including epidural management and anesthesia for C-section. I don't know your plan for the delivery whether it a natural birth, water birth, IV sedation, nitrous supplementation, doula or epidural, but we want to meet your pain goals."   1.Was your pain managed to your expectations on prior hospitalizations?   No prior hospitalizations  2.What is your expectation for pain management during this hospitalization?     Epidural  3.How can we help you reach that goal? epidural  Record the patient's initial score and the patient's pain goal.   Pain: 0  Pain Goal: 4 [facility] wants you to be able to say your pain was always managed very well.  [name] 12/12/2017

## 2017-12-12 NOTE — Progress Notes (Signed)
Brittney Olson is a 26 y.o. G1P0 at 2463w3d by  admitted for active labor  Subjective:   Objective: BP 104/67   Pulse 76   Temp 98.4 F (36.9 C) (Oral)   Resp 18   Ht 5\' 1"  (1.549 m)   Wt 63 kg (139 lb)   LMP 03/11/2017 Comment: Needs pregnancy test  SpO2 98%   BMI 26.26 kg/m  No intake/output data recorded. No intake/output data recorded.  FHT:  FHR: 145 bpm, variability: moderate,  accelerations:  Present,  decelerations:  Absent UC:   regular, every 5 minutes SVE:   Dilation: 5 Effacement (%): 100 Station: -1 Exam by:: dr Richardson Doppcole  Labs: Lab Results  Component Value Date   WBC 15.8 (H) 12/12/2017   HGB 12.6 12/12/2017   HCT 37.4 12/12/2017   MCV 80.6 12/12/2017   PLT 299 12/12/2017    Assessment / Plan: Spontaneous labor, progressing normally  Labor: Progressing normally Preeclampsia:  NA Fetal Wellbeing:  Category I Pain Control:  Epidural I/D:  n/a Anticipated MOD:  NSVD  Brittney Urbani J. 12/12/2017, 12:01 PM

## 2017-12-12 NOTE — H&P (Signed)
Brittney Olson is a 26 y.o. female presenting for contractions.  Patient is under the care of  [facility] and is attended by Dr. Delrae Sawyers. Cole. Pregnancy and medical history has been unremarkable.  Patient states contractions started irregularly about 3 days ago and has been getting progressively worse.  Patient reports active fetus, some bloody show, and denies LOF or N/V.  OB History    Gravida Para Term Preterm AB Living   1             SAB TAB Ectopic Multiple Live Births                 Past Medical History:  Diagnosis Date  . Anemia    Past Surgical History:  Procedure Laterality Date  . NO PAST SURGERIES     Family History: family history includes Cancer in her maternal grandfather. Social History:  reports that she quit smoking about 4 years ago. she has never used smokeless tobacco. She reports that she does not drink alcohol or use drugs.     Maternal Diabetes: No Genetic Screening: Declined Maternal Ultrasounds/Referrals: Normal Fetal Ultrasounds or other Referrals:  None Maternal Substance Abuse:  No Significant Maternal Medications:  None Significant Maternal Lab Results:  Lab values include: Group B Strep negative Other Comments:  None  Review of Systems  Constitutional: Negative.   Eyes: Negative.   Gastrointestinal: Positive for abdominal pain (W/ctx). Negative for constipation, diarrhea, nausea and vomiting.   Maternal Medical History:  Reason for admission: Contractions.  Nausea.  Contractions: Onset was more than 2 days ago.   Frequency: irregular.   Perceived severity is moderate.    Fetal activity: Perceived fetal activity is normal.   Last perceived fetal movement was within the past hour.    Prenatal complications: no prenatal complications Prenatal Complications - Diabetes: none.    Dilation: 3 Effacement (%): 90 Station: -1 Exam by:: Camelia Enganielle Simpson RN Blood pressure 111/76, pulse 87, temperature 98 F (36.7 C), temperature  source Oral, resp. rate 18, height 5\' 1"  (1.549 m), weight 63 kg (139 lb), last menstrual period 03/11/2017, SpO2 98 %. Maternal Exam:  Uterine Assessment: Contraction strength is moderate.  Contraction frequency is irregular.   Abdomen: Patient reports no abdominal tenderness. Fundal height is AGA.   Estimated fetal weight is 6lbs.   Fetal presentation: vertex     Fetal Exam Fetal Monitor Review: Baseline rate: 150.  Variability: moderate (6-25 bpm).   Pattern: accelerations present and no decelerations.    Fetal State Assessment: Category I - tracings are normal.     Physical Exam  Vitals reviewed. Constitutional: She is oriented to person, place, and time. She appears well-developed and well-nourished. No distress.  HENT:  Head: Normocephalic and atraumatic.  Eyes: Conjunctivae are normal.  Neck: Normal range of motion.  Cardiovascular: Normal rate, regular rhythm and normal heart sounds.  Respiratory: Effort normal and breath sounds normal.  GI: Soft. Bowel sounds are normal.  Musculoskeletal: Normal range of motion. She exhibits no edema.  Neurological: She is alert and oriented to person, place, and time.  Skin: Skin is warm and dry.  Psychiatric: She has a normal mood and affect. Her behavior is normal.    Prenatal labs: ABO, Rh: --/--/B POS (12/21 [id number]) Antibody: PENDING (12/21 0520) Rubella:  Immune  RPR:   NR HBsAg:   Negative HIV:   NR GBS:   Negative  Assessment/Plan: IUP at 39.3wks Cat I FT Early Active Labor GBS Negative  [facility]  Routine Labor and Delivery Orders per [facility] Protocol In room to complete assessment and discuss POC: Expectant Mgmt at current Dr.T. Richardson Doppole updated on patient status and care assumed  Cherre RobinsJessica L Harding Thomure MSN, CNM 12/12/2017, 6:08 AM

## 2017-12-12 NOTE — MAU Note (Signed)
PT SAYS HAS BEEN UC  ALL DAY   VE IN OFFICE   1  CM

## 2017-12-12 NOTE — Progress Notes (Signed)
Brittney Olson Brittney Olson is a 26 y.o. G1P0 at 8768w3d by LMP admitted for active labor  Subjective: Patient is comfortable with her epidural.   Objective: BP 115/60   Pulse 81   Temp 98.2 F (36.8 C) (Oral)   Resp 18   Ht 5\' 1"  (1.549 m)   Wt 63 kg (139 lb)   LMP 03/11/2017 Comment: Needs pregnancy test  SpO2 98%   BMI 26.26 kg/m  No intake/output data recorded. No intake/output data recorded.  FHT:  FHR: 145 bpm, variability: moderate,  accelerations:  Present,  decelerations:  Absent UC:   regular, every 2-4 minutes SVE:  4/90/-1 Labs: Lab Results  Component Value Date   WBC 15.8 (H) 12/12/2017   HGB 12.6 12/12/2017   HCT 37.4 12/12/2017   MCV 80.6 12/12/2017   PLT 299 12/12/2017    Assessment / Plan: Spontaneous labor, progressing normally  Labor: Progressing normally Preeclampsia:  NA Fetal Wellbeing:  Category I Pain Control:  Epidural I/D:  n/a Anticipated MOD:  NSVD  Shaft Corigliano J. 12/12/2017, 8:25 AM

## 2017-12-12 NOTE — Anesthesia Procedure Notes (Signed)
Epidural Patient location during procedure: OB  Staffing Anesthesiologist: Phillips Groutarignan, Loveda Colaizzi, MD Performed: anesthesiologist   Preanesthetic Checklist Completed: patient identified, site marked, surgical consent, pre-op evaluation, timeout performed, IV checked, risks and benefits discussed and monitors and equipment checked  Epidural Patient position: sitting Prep: DuraPrep Patient monitoring: heart rate, continuous pulse ox and blood pressure Approach: right paramedian Location: L3-L4 Injection technique: LOR saline  Needle:  Needle type: Tuohy  Needle gauge: 17 G Needle length: 9 cm and 9 Catheter type: closed end flexible Catheter size: 20 Guage Catheter at skin depth: 10 cm Test dose: negative  Assessment Events: blood not aspirated, injection not painful, no injection resistance, negative IV test and no paresthesia  Additional Notes Patient identified. Risks/Benefits/Options discussed with patient including but not limited to bleeding, infection, nerve damage, paralysis, failed block, incomplete pain control, headache, blood pressure changes, nausea, vomiting, reactions to medication both or allergic, itching and postpartum back pain. Confirmed with bedside nurse the patient's most recent platelet count. Confirmed with patient that they are not currently taking any anticoagulation, have any bleeding history or any family history of bleeding disorders. Patient expressed understanding and wished to proceed. All questions were answered. Sterile technique was used throughout the entire procedure. Please see nursing notes for vital signs. Test dose was given through epidural needle and negative prior to continuing to dose epidural or start infusion. Warning signs of high block given to the patient including shortness of breath, tingling/numbness in hands, complete motor block, or any concerning symptoms with instructions to call for help. Patient was given instructions on fall risk and  not to get out of bed. All questions and concerns addressed with instructions to call with any issues.

## 2017-12-12 NOTE — MAU Note (Signed)
Pt reports worsening contractions that started 4 hours ago-every 3-8 mins. Pt denies LOF or vaginal bleeding. Reports a brown discharge. Reports good fetal movement. States cervix was 1cm yesterday.

## 2017-12-13 LAB — CBC
HEMATOCRIT: 31 % — AB (ref 36.0–46.0)
Hemoglobin: 10.3 g/dL — ABNORMAL LOW (ref 12.0–15.0)
MCH: 26.8 pg (ref 26.0–34.0)
MCHC: 33.2 g/dL (ref 30.0–36.0)
MCV: 80.7 fL (ref 78.0–100.0)
Platelets: 234 10*3/uL (ref 150–400)
RBC: 3.84 MIL/uL — AB (ref 3.87–5.11)
RDW: 14.3 % (ref 11.5–15.5)
WBC: 16.7 10*3/uL — AB (ref 4.0–10.5)

## 2017-12-13 NOTE — Anesthesia Postprocedure Evaluation (Signed)
Anesthesia Post Note  Patient: Brittney Olson  Procedure(s) Performed: AN AD HOC LABOR EPIDURAL     Patient location during evaluation: Mother Baby Anesthesia Type: Epidural Level of consciousness: awake and alert and oriented Pain management: satisfactory to patient Vital Signs Assessment: post-procedure vital signs reviewed and stable Respiratory status: spontaneous breathing and nonlabored ventilation Cardiovascular status: stable Postop Assessment: no headache, no backache, no signs of nausea or vomiting, adequate PO intake and patient able to bend at knees (patient up walking) Anesthetic complications: no    Last Vitals:  Vitals:   12/12/17 1930 12/12/17 2340  BP: 112/70 117/67  Pulse: 72 87  Resp: 18 18  Temp: 37.8 C 36.8 C  SpO2:      Last Pain:  Vitals:   12/12/17 2340  TempSrc: Oral  PainSc: 2    Pain Goal:                 [name]

## 2017-12-13 NOTE — Progress Notes (Signed)
CSW received consult due to score 10 on Edinburgh Depression Screen.    LCSW met with MOB and FOB at the bedside to engage with education regarding PPD and PPA.  MOB reports she was tired, but overall engaged and smiling.  Baby was getting a bath and then going to do skin to skin with mom.  MOB had strong support in room with multiple family members and husband at bedside.  MOB voices she understands information given and left for review. She voices no concerns or questions at this time.  MOB reports she will go back to work in 6 weeks, thus has time to bond with baby.  CSW provided education regarding Baby Blues vs PMADs and provided MOB with information about support groups [facility].  CSW encouraged MOB to evaluate her mental health throughout the postpartum period with the use of the New Mom Checklist developed by Postpartum Progress and notify a medical professional if symptoms arise.    Casy Tavano LCSW, MSW Clinical Social Work: [employer]   

## 2017-12-13 NOTE — Progress Notes (Signed)
Post Partum Day 1 SVD Subjective: no complaints, up ad lib, voiding and tolerating PO  Objective: Blood pressure (!) 96/53, pulse 87, temperature 98.2 F (36.8 C), temperature source Oral, resp. rate 18, height 5\' 1"  (1.549 m), weight 63 kg (139 lb), last menstrual period 03/11/2017, SpO2 98 %, unknown if currently breastfeeding.  Physical Exam:  General: alert and cooperative Lochia: appropriate Uterine Fundus: firm Incision: NA DVT Evaluation: No evidence of DVT seen on physical exam.  Recent Labs    12/12/17 0520 12/13/17 0511  HGB 12.6 10.3*  HCT 37.4 31.0*    Assessment/Plan: Plan for discharge tomorrow, Breastfeeding and Lactation consult  Chorioamnionitis- Unasyn for 24 hours post delivery.  Pt may shower    LOS: 1 day   Brittney Olson J. 12/13/2017, 10:20 AM

## 2017-12-13 NOTE — Lactation Note (Signed)
This note was copied from a baby's chart. Lactation Consultation Note Baby 13 hrs old. New mom wondering if she has enough milk for the baby. Hand expression demonstrated colostrum. Educated on milk storage, hand expression, STS, I&O, cluster feeding, supply and demand. Mom has round breast w/everted nipples and bulbous areolas. Mom happy to see colostrum. Mom states she wants just to BF, but questioning giving "something" so baby wouldn't be hungry. Un-swaddled baby, encouraged not to have wrapped in a lot of blankets for feedings. Discussed hand expression and collect in vial to spoon feed. Baby latched well. Assisted in football position, mom has Bf in cradle position.  Newborn behavior, and feeding habits reviewed. Mom encouraged to feed baby 8-12 times/24 hours and with feeding cues. Encouraged to call for assistance if needed.  [facility]/LC brochure given w/resources, support groups and LC services  .Patient Name: Brittney Olson   BJYNW'GToday's Date: 12/13/2017 Reason for consult: Initial assessment   Maternal Data Has patient been taught Hand Expression?: Yes Does the patient have breastfeeding experience prior to this delivery?: No  Feeding Feeding Type: Breast Fed Length of feed: 15 min  LATCH Score Latch: Repeated attempts needed to sustain latch, nipple held in mouth throughout feeding, stimulation needed to elicit sucking reflex.  Audible Swallowing: Spontaneous and intermittent  Type of Nipple: Everted at rest and after stimulation  Comfort (Breast/Nipple): Soft / non-tender  Hold (Positioning): Assistance needed to correctly position infant at breast and maintain latch.  LATCH Score: 8  Interventions Interventions: Breast feeding basics reviewed;Breast compression;Assisted with latch;Adjust position;Skin to skin;Support pillows;Breast massage;Position options;Hand express;Expressed milk  Lactation Tools Discussed/Used     Consult Status Consult Status:  Follow-up Date: 12/14/17 Follow-up type: In-patient    Charyl DancerCARVER, Clara Herbison G 12/13/2017, 5:50 AM

## 2017-12-14 MED ORDER — IBUPROFEN 600 MG PO TABS: 600.0000 mg | ORAL_TABLET | Freq: Four times a day (QID) | ORAL | 0 refills | Status: DC | PRN

## 2017-12-14 NOTE — Lactation Note (Signed)
This note was copied from a baby's chart. Lactation Consultation Note  Patient Name: Brittney Olson ZOXWR'UToday's Date: 12/14/2017 Reason for consult: Follow-up assessment;Infant weight loss;Other (Comment)(no stool last 24 hours)   Follow up with first time mom of term infant. Infant with 9 BF for 10-40 minutes, 5 voids and 0 stools in last 24 hours (5 stools in life). LATCH scores 8-10. Infant weight 6 lb 3.3 oz with weight loss of 7%.   Mom was holding infant and infant was asleep. Mom reports infant just fed for 15 minutes. Awakened infant to feed again and took her clothes off to place her STS. Mom able to latch infant to left breast in the cross cradle hold. Infant initially shallowly latched, showed mom how to flange lips to widen gape. Mom reports latch felt better. Enc mom to stimulate infant and massage/intermittently compress breast with feeding. Swallows increased with breast compression.   Mom's milk is coming in, she is able to hand express very easily. Mom with positional stripe to left nipple on upper half, mom is using Lanolin to nipple. Enc mom to use EBM and follow with coconut oil, coconut oil given for use.   Reviewed I/O, Signs of dehydration in the infant, engorgement prevention/treatment and breast milk expression and storage. [facility] reviewed, mom aware of OP services, BF Support Groups and LC phone #. Mom has a manual pump for home use.   Infant with follow up Ped appt in the AM. Mom has great family support.   Mom reports she has no further questions/concerns at this time. Enc mom to call with any questions/concerns as needed.    Maternal Data Formula Feeding for Exclusion: No Has patient been taught Hand Expression?: Yes Does the patient have breastfeeding experience prior to this delivery?: No  Feeding Feeding Type: Breast Fed Length of feed: 10 min  LATCH Score Latch: Repeated attempts needed to sustain latch, nipple held in mouth throughout  feeding, stimulation needed to elicit sucking reflex.  Audible Swallowing: Spontaneous and intermittent  Type of Nipple: Everted at rest and after stimulation  Comfort (Breast/Nipple): Filling, red/small blisters or bruises, mild/mod discomfort  Hold (Positioning): Assistance needed to correctly position infant at breast and maintain latch.  LATCH Score: 7  Interventions Interventions: Breast feeding basics reviewed;Support pillows;Assisted with latch;Position options;Skin to skin;Expressed milk;Breast compression;Hand express;Coconut oil  Lactation Tools Discussed/Used WIC Program: Yes Pump Review: Milk Storage   Consult Status Consult Status: Complete Follow-up type: Call as needed    Ed BlalockSharon S Hice 12/14/2017, 3:20 PM

## 2017-12-14 NOTE — Discharge Summary (Signed)
OB Discharge Summary     Patient Name: Brittney Olson DOB: 03-13-1991 MRN: [id number]  Date of admission: 12/12/2017 Delivering MD: Gerald LeitzOLE, Hashir Deleeuw   Date of discharge: 12/14/2017  Admitting diagnosis: 39WKS CTX Intrauterine pregnancy: 5477w3d     Secondary diagnosis:  Active Problems:   Indication for care in labor or delivery  Additional problems: None     Discharge diagnosis: Term Pregnancy Delivered                                                                                                Post partum procedures:None  Augmentation: Pitocin  Complications: None  Hospital course:  Onset of Labor With Vaginal Delivery     26 y.o. yo G1P1001 at 8477w3d was admitted in Active Labor on 12/12/2017. Patient had an uncomplicated labor course as follows:  Membrane Rupture Time/Date: 11:42 AM ,12/12/2017   Intrapartum Procedures: Episiotomy: None [1]                                         Lacerations:  2nd degree [3]  Patient had a delivery of a Viable infant. 12/12/2017  Information for the patient's newborn:  Brittney BolusMedina-Headrick, Girl Brittney Olson [[id number]][[id number]]       Pateint had an uncomplicated postpartum course.  She is ambulating, tolerating a regular diet, passing flatus, and urinating well. Patient is discharged home in stable condition on 12/14/17.   Physical exam  Vitals:   12/12/17 2340 12/13/17 0728 12/13/17 1747 12/14/17 0529  BP: 117/67 (!) 96/53 106/72 101/69  Pulse: 87 80 77 75  Resp: 18 16 17 18   Temp: 98.2 F (36.8 C) 97.8 F (36.6 C) 98.2 F (36.8 C) 97.9 F (36.6 C)  TempSrc: Oral Oral Oral Oral  SpO2:  99% 100%   Weight:      Height:       General: alert, cooperative and no distress Lochia: appropriate Uterine Fundus: firm Incision: N/A DVT Evaluation: No evidence of DVT seen on physical exam. Labs: Lab Results  Component Value Date   WBC 16.7 (H) 12/13/2017   HGB 10.3 (L) 12/13/2017   HCT 31.0 (L) 12/13/2017   MCV 80.7 12/13/2017   PLT 234  12/13/2017   No flowsheet data found.  Discharge instruction: per After Visit Summary and "Baby and Me Booklet".  After visit meds:  Allergies as of 12/14/2017   No Known Allergies     Medication List    TAKE these medications   ibuprofen 600 MG tablet Commonly known as:  ADVIL,MOTRIN Take 1 tablet (600 mg total) by mouth every 6 (six) hours as needed.   prenatal multivitamin Tabs tablet Take 1 tablet by mouth daily at 12 noon.   ranitidine 150 MG tablet Commonly known as:  ZANTAC Take 150 mg by mouth at bedtime.       Diet: routine diet  Activity: Advance as tolerated. Pelvic rest for 6 weeks.   Outpatient follow up:6 weeks Follow up Appt:No future appointments. Follow up Visit:No Follow-up on  file.  Postpartum contraception: Not Discussed  Newborn Data: Live born female  Birth Weight: 6 lb 10.5 oz (3020 g) APGAR: 9, 9  Newborn Delivery   Birth date/time:  12/12/2017 16:22:00 Delivery type:  Vaginal, Spontaneous     Baby Feeding: Breast Disposition:home with mother   12/14/2017 Brittney AversOLE,Brittney Lilley J., MD

## 2017-12-14 NOTE — Discharge Instructions (Signed)
Parto vaginal Vaginal Delivery Parto vaginal significa que usted dar a luz empujando al beb fuera del canal del parto (vagina). Un equipo de proveedores de atencin mdica la ayudar antes, durante y despus del parto vaginal. Las experiencias de los nacimientos son nicas para todas las mujeres y Sports administrator y las experiencias de nacimiento varan segn dnde elija dar a luz. Qu debo hacer a fin de prepararme para el nacimiento de mi beb? Antes del nacimiento de su beb, es importante que hable con su mdico sobre lo siguiente:  Sus preferencias en cuanto al Aleen Campi de parto y [place]. Estas pueden incluir lo siguiente: ? Science writer. ? Cmo Human resources officer. Esto podra incluir tcnicas de alivio del dolor no mdicas o medicamentos inyectables para Engineer, materials como la analgesia epidural. ? Cmo se los controlar a usted y a su beb durante el Aleen Campi de parto y [place]. ? Quin puede estar en la sala de [place] de parto y parto con usted. ? Sus opiniones en cuanto al parto quirrgico de su beb (parto por cesrea [place] Old) si esto fuera necesario. ? Sus opiniones en cuanto a recibir sangre donada a travs de un tubo (catter) intravenoso (transfusin de [place]) si esto fuera necesario.  Si usted puede: ? Tomar fotografas o videos del nacimiento. ? Comer durante el [place] de parto y [place]. ? Moverse, caminar o Multimedia programmer de posicin durante el [place] de parto y [place].  Qu esperar despus del nacimiento de su beb, como: ? Si se ofrece el pinzamiento y corte tardo del cordn umbilical. ? Quin cuidar a su beb inmediatamente despus del nacimiento. ? Medicamentos o pruebas que pueden recomendarse para su beb. ? Si su hospital o centro de parto apoya la lactancia. ? Cunto Furniture conservator/restorer hospital o en el centro de [place].  Cmo cualquier afeccin mdica que usted tenga puede afectar a su beb o la experiencia de Aleen Campi de parto y  [place].  A fin de prepararse para el nacimiento de su beb, usted tambin debe:  Asistir a todas sus visitas de atencin mdica antes del parto (visitas prenatales) segn lo recomendado por su mdico. Esto es importante.  Preparacin del hogar para la llegada de su beb. Asegrese de tener lo siguiente: ? Paales. ? Ropa de beb. ? Equipo de alimentacin. ? Haga preparativos para que usted y su beb puedan dormir de IT consultant.  Instale un asiento de beb en su vehculo. Haga verificar el asiento de beb de su coche por un instalador de asientos de beb para asegurarse de que est instalado en forma segura.  Piense en quin la ayudar con su nuevo beb en el hogar durante al [employer] las primeras semanas despus del [place].  Qu puedo esperar cuando llegue al centro de parto o el hospital? Pollyann Savoy que se inicie el [place] de parto y haya sido admitida en el hospital o centro de parto, el mdico podr hacer lo siguiente:  Revisar sus antecedentes de Psychiatrist y cualquier inquietud que usted pueda tener.  Colocar un tubo (catter) intravenoso en una de sus venas. Esto se [place] para administrarle lquidos y medicamentos.  Verificar su presin arterial, temperatura y pulso y la frecuencia cardaca (signos vitales).  Verificar si la bolsa de agua (saco amnitico) se ha roto (ruptura).  Hablar con usted sobre su plan de nacimiento y Chiropractor las opciones para Human resources officer.  Monitoreo Su mdico puede monitorear las contracciones (monitoreo uterino) y  el ritmo cardaco del beb (monitoreo fetal). Es posible que el monitoreo se necesite realizar:  Con frecuencia, pero no continuamente (intermitentemente).  Todo el tiempo o durante largos perodos a la vez (continuamente). El monitoreo continuo puede ser necesario si: ? Usted est recibiendo Dillard'sdeterminados medicamentos, tales como medicamentos para Engineer, materialsaliviar el dolor o para hacer que las contracciones ms fuertes. ? Tiene complicaciones  durante el embarazo o el Jordantrabajo de Copp[place]parto.  El monitoreo se puede realizar:  Al colocar un estetoscopio especial o un dispositivo manual de monitoreo en el abdomen o verificar los latidos cardacos de su beb y sentir su abdomen para controlar de contracciones. Este mtodo de monitoreo no registra los latidos cardacos de su beb ni sus contracciones de Des Peresmanera continua.  Colocar monitores en el abdomen (monitores externos) para Passenger transport managerregistrar los latidos cardacos de su beb y la frecuencia y duracin de las contracciones. No tendr que tener colocados los monitores externos en todo momento.  Colocar monitores dentro del tero (monitores internos) para Passenger transport managerregistrar los latidos cardacos de su beb y la frecuencia, duracin y fuerza de sus contracciones. ? Su mdico podr usar monitores internos si necesita ms informacin sobre la fuerza de las contracciones o la frecuencia cardaca del beb. ? Los monitores internos se colocan pasando un cable delgado y flexible a travs de la vagina hasta el tero. Segn el tipo de monitor, Insurance claims handlerpuede permanecer en el tero o en la cabeza del beb hasta el nacimiento. ? Su mdico analizar con usted los beneficios y los riesgos de usar un monitor interno y Chief Executive Officerle pedir permiso antes de Psychologist, occupationalcolocar los monitores.  Telemetra. Se trata de un tipo de monitoreo continuo que se puede Education officer, environmentalrealizar con monitores externos o internos. En lugar de Hospital doctortener que permanecer en la cama, usted puede moverse durante Fish farm managerla telemetra. Pregunte a su mdico si la telemetra es una opcin para usted.  Examen fsico. Su mdico puede realizarle un examen fsico. Esto puede incluir lo siguiente:  Verificar en qu posicin se encuentra su beb: ? Con la cabeza hacia la vagina (cabeza abajo). Esta es la posicin ms frecuente. ? Con la cabeza Parker Hannifinhacia la parte superior del tero (cabeza Seychellesarriba o de nalgas). Si su beb est en una posicin de nalgas, su mdico puede tratar de hacerlo girar para que quede cabeza abajo a  fin de poder tener un parto vaginal. Si parece que su beb no puede nacer con parto vaginal, su mdico puede recomendar una ciruga para dar a luz al beb. En casos muy poco frecuentes, usted puede dar a luz con parto vaginal si el beb est cabeza arriba (parto de nalgas). ? Posicin lateral (transversal). Los bebs que estn en posicin lateral no pueden nacer por parto vaginal.  Verificar el cuello uterino para determinar: ? Si se est afinando o estirando (borrando). ? Si se est abriendo (dilatando). ? Cunto se ha movido o ha bajado su beb por el canal del parto.  Cules son las tres etapas del Leesporttrabajo de Calum[place] Parkparto y Goodyearel parto?  El River Roadtrabajo de parto y el parto normales se dividen en tres etapas: Etapa 1  La etapa 1 es la etapa ms larga del Corwin Springstrabajo de Walnutparto y puede durar horas o Locklanddas. La etapa 1 incluye: ? Trabajo de parto temprano. Esto es cuando las contracciones pueden ser irregulares o regulares y leves. En general, las contracciones del trabajo de parto temprano se producen con ms de 10 minutos de diferencia. ? Tresa Moorerabajo de parto activo. Esto es Kerr-McGeecuando las  contracciones son ms largas, ms regulares, ms frecuentes y ms intensas. ? La fase de transicin. Esto es cuando las contracciones ocurren muy seguidas, son Lynnae Sandhoffmuy intensas y pueden durar ms que durante cualquier otra parte del Brighttrabajo de Evermanparto.  En general, las contracciones son leves, infrecuentes e irregulares al principio. Se hacen ms fuertes, ms frecuentes (aproximadamente cada 2 o 3 minutos) y ms regulares a medida que usted avanza desde un trabajo de parto temprano [place] un [facility] [employer] [facility] Johnstowntrabajo de Iranparto activo y fase de transicin.  Muchas mujeres progresan a travs de la etapa 1 de forma natural, pero es posible que usted necesite ayuda para continuar avanzando. Si esto ocurre, su mdico puede hablar con usted sobre lo siguiente: ? La ruptura del saco amnitico si es que an no ha ocurrido. ? Administracin de medicamentos para ayudarla  a tener contracciones ms fuertes y ms frecuentes.  La etapa 1 finaliza cuando el cuello uterino est completamente dilatado hasta 4 pulgadas (10cm) y completamente borrado. Esto ocurre al final de la fase de transicin. Etapa 2  Una vez que el cuello uterino est totalmente borrado y dilatado a 4 pulgadas (10cm), usted puede comenzar a sentir ganas de pujar. Es comn que el cuerpo tome un descanso de Brunswickmanera natural antes de sentir ganas de pujar, especialmente si recibe una epidural u otros medicamentos para Chief Technology Officerel dolor. Este perodo de descanso puede durar un mximo de 1 a 2 horas, segn su experiencia de parto nica.  Durante la etapa 2, las contracciones son generalmente menos doloras porque pujar ayuda a Engineer, materialsaliviar el dolor de las contracciones. En lugar del dolor de las contracciones, puede sentir un dolor urente y por estiramiento, especialmente cuando la parte ms ancha de la cabeza de su beb pasa a travs de la abertura vaginal (coronacin).  Su mdico controlar atentamente su avance con los pujos y el avance del beb a travs de la vagina durante la etapa 2.  Su mdico puede Advice workermasajear el rea de la piel entre la abertura vaginal y el ano (perineo) o aplicar compresas tibias en el perineo. Esto ayuda al estiramiento ya que la cabeza del beb empieza a Research officer, trade unionaparecer, lo cual puede ayudar a evitar un desgarro perineal. ? En algunos casos, se puede hacer una incisin en el peritoneo (episiotoma) para permitir que el beb pase a travs de la abertura vaginal. La episiotoma sirve para agrandar la abertura vaginal a fin de que el beb tenga ms espacio para pasar durante el parto.  Es muy importante respirar y concentrarse para que el mdico pueda controlar la salida de la cabeza del beb. Es posible que su mdico tenga que disminuir la intensidad de los pujos para ayudar a Astronomerevitar un desgarro perineal.  Despus de que sale la cabeza del beb, en general salen los hombros y el resto del cuerpo muy  rpidamente y sin dificultad.  Una vez que el beb nace, se debe cortar el cordn umbilical de inmediato o esto puede demorar 1 o 2 minutos, segn la salud del beb. Este procedimiento puede variar segn el mdico, el hospital y [place] centro de Culebraparto.  Si usted y su beb estn lo suficientemente sanos, se Journalist, newspaperle colocar el beb en el pecho o el abdomen para ayudar a Tour managermantener la temperatura del beb y el vnculo entre ustedes. Algunas madres y bebs comienzan la lactancia en este momento. Su equipo mdico secar al beb y lo ayudar a Research scientist (life sciences)mantenerse caliente Amgen Incdurante este tiempo.  Es posible que su beb necesite atencin  inmediata si: ? Mostr signos de sufrimiento durante el trabajo de [place]. ? Tiene una afeccin mdica. ? Naci antes de tiempo (prematuro). ? Defeca antes del nacimiento (meconio). ? Muestra signos de dificultar en la transicin de estar dentro del tero a estar fuera del tero. Si tiene Industrial/product designer, su equipo mdico la ayudar a Lawyer. Etapa 3  La tercera etapa del trabajo de parto comienza inmediatamente despus del nacimiento de su beb y finaliza despus de la expulsin de la placenta. La placenta es un rgano de que desarrolla durante el embarazo para proporcionar oxgeno y nutrientes al beb en el tero.  La expulsin de la placenta puede requerir algunos pujos y es posible que usted tenga contracciones leves. La lactancia puede estimular las contracciones para ayudar a expulsar la placenta.  Luego de la expulsin de la placenta, el tero debe (contraerse) y [place]. Esto ayuda a Civil Service fast streamer. Para ayudar al tero a contraerse y controlar el [place], [place] mdico puede: ? Administrarle un medicamento inyectable, a travs de un tubo (catter) intravenoso, por boca o a travs del recto (por va rectal). ? Masajear el abdomen o realizar un examen de la vagina para extraer cualquier cogulo de sangre que quede en el tero. ? Vaciar la  vejiga colocando un tubo flexible (catter) en la vejiga. ? Alentarla a amamantar a su beb. Una vez que termina el [place] de [place], se los controlar a usted y a su beb atentamente para Warehouse manager la seguridad de que ambos estn sanos hasta que estn listos para ir a casa. Su equipo de atencin Art gallery manager cmo cuidarse y cuidar a su beb. Esta informacin no tiene Theme park manager el consejo del mdico. Asegrese de hacerle al mdico cualquier pregunta que tenga. Document Released: 11/21/2008 Document Revised: 03/20/2017 Document Reviewed: 12/24/2015 Elsevier Interactive Patient Education  2018 [employer].   Parto vaginal, cuidados posteriores Vaginal Delivery, Care After Siga estas instrucciones durante las prximas semanas. Estas indicaciones le proporcionan informacin acerca de cmo deber cuidarse despus del parto vaginal. Su mdico tambin podr darle indicaciones ms especficas. El tratamiento ha sido planificado segn las prcticas mdicas actuales, pero en algunos casos pueden ocurrir problemas. Llame al mdico si tiene problemas o preguntas. Qu puedo esperar despus del procedimiento? Despus de un parto vaginal, es frecuente tener lo siguiente:  Hemorragia leve de la vagina.  Dolor en el abdomen, la vagina y la zona de la piel entre la abertura vaginal y el ano (perineo).  Calambres plvicos.  Fatiga.  Siga estas indicaciones en su casa: Medicamentos  Tome los medicamentos de venta libre y los recetados solamente como se lo haya indicado el mdico.  Si le recetaron un antibitico, tmelo como se lo haya indicado el mdico. No interrumpa la administracin del antibitico hasta que lo haya terminado. Conducir   No conduzca ni opere maquinaria pesada mientras toma analgsicos recetados.  No conduzca durante 24horas si le administraron un sedante. Estilo de vida  No beba alcohol. Esto es de suma importancia si est amamantando o toma analgsicos.  No  consuma productos que contengan tabaco, incluidos cigarrillos, tabaco de Theatre manager o cigarrillos electrnicos. Si necesita ayuda para dejar de fumar, consulte al mdico. Qu debe comer y beber  Beba al menos 8vasos de ochoonzas (240cc) de agua todos los [address]  [address] a menos que el mdico le indique lo contrario. Si elige amamantar al beb, quiz deba beber an ms cantidad de agua.  Coma alimentos ricos  en [employer]. Estos alimentos pueden ayudarla a prevenir o Educational psychologist. Los alimentos ricos en fibras incluyen, entre otros: ? Panes y cereales integrales. ? Arroz integral. ? Armed forces operational officer. ? Nils Pyle y verduras frescas. Actividad  Retome sus actividades normales como se lo haya indicado el mdico. Pregntele al mdico qu actividades son seguras para usted.  Descanse todo lo que pueda. Trate de descansar o tomar una siesta mientras el beb est durmiendo.  No levante objetos que pesen ms que su beb o 10libras (4,5kg) hasta que el mdico le diga que es seguro.  Hable con el mdico sobre cundo puede retomar la actividad sexual. Esto puede depender de lo siguiente: ? Riesgo de sufrir una infeccin. ? Velocidad de cicatrizacin. ? Comodidad y deseo de Clinical research associate sexual. Cuidados vaginales  Si le realizaron una episiotoma o tuvo un desgarro vaginal, contrlese la zona [employer] para detectar signos de infeccin. Est atenta a los siguientes signos: ? Aumento del enrojecimiento, la hinchazn o Chief Technology Officer. ? Mayor presencia de lquido o [place]. ? Calor. ? Pus o mal olor.  No use tampones ni se haga duchas vaginales hasta que el mdico la autorice.  Controle la sangre que elimina por la vagina para detectar cogulos de [place]. Estos pueden tener el aspecto de grumos de color rojo oscuro, o secrecin marrn o negra. Instrucciones generales  Mantenga el perineo limpio y seco, como se lo haya indicado el mdico.  Use ropa cmoda y suelta.  Cuando vaya al  bao, siempre higiencese de adelante [employer].  Pregntele al mdico si puede ducharse o tomar baos de inmersin. Si se le realiz una episiotoma o tuvo un desgarro perineal durante el trabajo del parto o el parto, es posible que el mdico le indique que no tome baos de inmersin durante un determinado tiempo.  Use un sostn que sujete y ajuste bien sus pechos.  Si es posible, pdale a alguien que la ayude con las tareas del hogar y a Scientist, product/process development del beb durante al menos [employer] despus de que le den el alta del hospital.  Chauncy Passy a todas las visitas de seguimiento para usted y el beb, como se lo haya indicado el mdico. Esto es importante. Comunquese con un mdico si:  Tiene los siguientes sntomas: ? Secrecin vaginal que tiene mal olor. ? Dificultad para orinar. ? Dolor al [employer]. ? Aumento o disminucin repentinos de la frecuencia de las deposiciones. ? Ms enrojecimiento, hinchazn o dolor alrededor de la episiotoma o del desgarro vaginal. ? Ms secrecin de lquido o sangre de la episiotoma o del desgarro vaginal. ? Pus o mal olor proveniente de la episiotoma o del desgarro vaginal. ? [place] Ruts. ? Erupcin cutnea. ? Poco inters o falta de inters en actividades que solan gustarle. ? Dudas sobre su cuidado y el del beb.  Siente la episiotoma o el desgarro vaginal caliente al tacto.  La episiotoma o el desgarro vaginal se abren o no IT sales professional.  Siente dolor en las [place], o estn duras o enrojecidas.  Siente tristeza o preocupacin de forma inusual.  Siente nuseas o vomita.  Elimina cogulos de sangre grandes por la vagina. Si expulsa un cogulo de sangre por la vagina, gurdelo para mostrrselo a su mdico. No tire la cadena sin que el mdico examine el cogulo de sangre antes.  Orina ms de lo habitual.  Se siente mareada o se desmaya.  No ha amamantado para nada y no ha tenido un perodo menstrual  durante 12 semanas despus del parto.  Dej  de amamantar al beb y no ha tenido su perodo menstrual durante 12 semanas despus de dejar de Museum/gallery exhibitions officer. Solicite ayuda de inmediato si:  Tiene los siguientes sntomas: ? Dolor que no desaparece o no mejora con medicamentos. ? Journalist, newspaper. ? Dificultad para respirar. ? Visin borrosa o Nurse, adult. ? Pensamientos de autolesionarse o lesionar al beb.  Comienza a Psychiatrist abdomen o en una de las piernas.  Presenta un dolor de cabeza intenso.  Se desmaya.  Tiene una hemorragia de la vagina tan intensa que empapa dos toallitas sanitarias en [place]. Esta informacin no tiene Theme park manager el consejo del mdico. Asegrese de hacerle al mdico cualquier pregunta que tenga. Document Released: 12/09/2005 Document Revised: 04/02/2017 Document Reviewed: 12/24/2015 Elsevier Interactive Patient Education  [employer].

## 2017-12-24 ENCOUNTER — Inpatient Hospital Stay (HOSPITAL_COMMUNITY): Payer: BLUE CROSS/BLUE SHIELD

## 2018-01-23 DIAGNOSIS — Z3042 Encounter for surveillance of injectable contraceptive: Secondary | ICD-10-CM | POA: Diagnosis not present

## 2018-01-23 DIAGNOSIS — F53 Postpartum depression: Secondary | ICD-10-CM | POA: Diagnosis not present

## 2018-05-06 ENCOUNTER — Other Ambulatory Visit: Payer: Self-pay | Admitting: Obstetrics and Gynecology

## 2018-05-06 ENCOUNTER — Other Ambulatory Visit (HOSPITAL_COMMUNITY)
Admission: RE | Admit: 2018-05-06 | Discharge: 2018-05-06 | Disposition: A | Discharge status: Home / Self Care | Payer: BLUE CROSS/BLUE SHIELD | Source: Ambulatory Visit | Attending: Obstetrics and Gynecology | Admitting: Obstetrics and Gynecology

## 2018-05-06 DIAGNOSIS — R1013 Epigastric pain: Secondary | ICD-10-CM | POA: Diagnosis not present

## 2018-05-06 DIAGNOSIS — Z01411 Encounter for gynecological examination (general) (routine) with abnormal findings: Secondary | ICD-10-CM | POA: Diagnosis not present

## 2018-05-06 DIAGNOSIS — Z3042 Encounter for surveillance of injectable contraceptive: Secondary | ICD-10-CM | POA: Diagnosis not present

## 2018-05-06 DIAGNOSIS — Z01419 Encounter for gynecological examination (general) (routine) without abnormal findings: Secondary | ICD-10-CM | POA: Diagnosis not present

## 2018-05-06 DIAGNOSIS — N914 Secondary oligomenorrhea: Secondary | ICD-10-CM | POA: Diagnosis not present

## 2018-05-07 LAB — CYTOLOGY - PAP: DIAGNOSIS: NEGATIVE

## 2018-05-19 DIAGNOSIS — Z3042 Encounter for surveillance of injectable contraceptive: Secondary | ICD-10-CM | POA: Diagnosis not present

## 2018-05-19 DIAGNOSIS — R1013 Epigastric pain: Secondary | ICD-10-CM | POA: Diagnosis not present

## 2018-06-22 DIAGNOSIS — K2900 Acute gastritis without bleeding: Secondary | ICD-10-CM | POA: Diagnosis not present

## 2018-07-17 DIAGNOSIS — Z3042 Encounter for surveillance of injectable contraceptive: Secondary | ICD-10-CM | POA: Diagnosis not present

## 2018-09-04 DIAGNOSIS — R102 Pelvic and perineal pain: Secondary | ICD-10-CM | POA: Diagnosis not present

## 2018-09-04 DIAGNOSIS — N898 Other specified noninflammatory disorders of vagina: Secondary | ICD-10-CM | POA: Diagnosis not present

## 2018-09-18 DIAGNOSIS — M238X2 Other internal derangements of left knee: Secondary | ICD-10-CM | POA: Diagnosis not present

## 2018-09-18 DIAGNOSIS — M238X1 Other internal derangements of right knee: Secondary | ICD-10-CM | POA: Diagnosis not present

## 2018-09-18 DIAGNOSIS — R102 Pelvic and perineal pain: Secondary | ICD-10-CM | POA: Diagnosis not present

## 2018-09-18 DIAGNOSIS — M2241 Chondromalacia patellae, right knee: Secondary | ICD-10-CM | POA: Diagnosis not present

## 2018-09-18 DIAGNOSIS — M2242 Chondromalacia patellae, left knee: Secondary | ICD-10-CM | POA: Diagnosis not present

## 2018-09-24 DIAGNOSIS — M2242 Chondromalacia patellae, left knee: Secondary | ICD-10-CM | POA: Diagnosis not present

## 2018-09-24 DIAGNOSIS — M2241 Chondromalacia patellae, right knee: Secondary | ICD-10-CM | POA: Diagnosis not present

## 2018-09-28 DIAGNOSIS — M2242 Chondromalacia patellae, left knee: Secondary | ICD-10-CM | POA: Diagnosis not present

## 2018-09-28 DIAGNOSIS — M2241 Chondromalacia patellae, right knee: Secondary | ICD-10-CM | POA: Diagnosis not present

## 2018-09-29 DIAGNOSIS — Z23 Encounter for immunization: Secondary | ICD-10-CM | POA: Diagnosis not present

## 2018-09-29 DIAGNOSIS — T2029XA Burn of second degree of multiple sites of head, face, and neck, initial encounter: Secondary | ICD-10-CM | POA: Diagnosis not present

## 2018-09-30 DIAGNOSIS — M2242 Chondromalacia patellae, left knee: Secondary | ICD-10-CM | POA: Diagnosis not present

## 2018-09-30 DIAGNOSIS — M2241 Chondromalacia patellae, right knee: Secondary | ICD-10-CM | POA: Diagnosis not present

## 2018-10-05 DIAGNOSIS — M2240 Chondromalacia patellae, unspecified knee: Secondary | ICD-10-CM | POA: Diagnosis not present

## 2018-10-07 DIAGNOSIS — M2240 Chondromalacia patellae, unspecified knee: Secondary | ICD-10-CM | POA: Diagnosis not present

## 2018-10-12 DIAGNOSIS — M2240 Chondromalacia patellae, unspecified knee: Secondary | ICD-10-CM | POA: Diagnosis not present

## 2018-11-12 DIAGNOSIS — Z23 Encounter for immunization: Secondary | ICD-10-CM | POA: Diagnosis not present

## 2018-11-12 DIAGNOSIS — Z00129 Encounter for routine child health examination without abnormal findings: Secondary | ICD-10-CM | POA: Diagnosis not present

## 2018-11-25 DIAGNOSIS — T148XXA Other injury of unspecified body region, initial encounter: Secondary | ICD-10-CM | POA: Diagnosis not present

## 2018-12-17 DIAGNOSIS — Z23 Encounter for immunization: Secondary | ICD-10-CM | POA: Diagnosis not present

## 2018-12-17 DIAGNOSIS — Z00129 Encounter for routine child health examination without abnormal findings: Secondary | ICD-10-CM | POA: Diagnosis not present

## 2018-12-17 DIAGNOSIS — Z1388 Encounter for screening for disorder due to exposure to contaminants: Secondary | ICD-10-CM | POA: Diagnosis not present

## 2018-12-17 DIAGNOSIS — Z130 Encounter for screening for diseases of the blood and blood-forming organs and certain disorders involving the immune mechanism: Secondary | ICD-10-CM | POA: Diagnosis not present

## 2018-12-23 NOTE — L&D Delivery Note (Signed)
Delivery Note At 11:16 AM a viable female was delivered via Vaginal, Spontaneous (Presentation: Left Occiput Anterior).  APGAR: 9, 9; weight 6 lb 13.2 oz (3095 g).   Placenta status: Spontaneous, Intact.  Cord: 3 vessels with the following complications: None.  Cord pH: NA  Anesthesia: Epidural Episiotomy: None Lacerations Periurethral  Suture Repair: 3.0 vicryl Est. Blood Loss (mL): 100  Mom to postpartum.  Baby to Couplet care / Skin to Skin.  Christophe Louis 12/20/2019, 1:20 PM

## 2019-01-29 DIAGNOSIS — S3141XA Laceration without foreign body of vagina and vulva, initial encounter: Secondary | ICD-10-CM | POA: Diagnosis not present

## 2019-01-29 DIAGNOSIS — N915 Oligomenorrhea, unspecified: Secondary | ICD-10-CM | POA: Diagnosis not present

## 2019-02-09 DIAGNOSIS — F419 Anxiety disorder, unspecified: Secondary | ICD-10-CM | POA: Diagnosis not present

## 2019-02-09 DIAGNOSIS — Z0000 Encounter for general adult medical examination without abnormal findings: Secondary | ICD-10-CM | POA: Diagnosis not present

## 2019-02-09 DIAGNOSIS — J101 Influenza due to other identified influenza virus with other respiratory manifestations: Secondary | ICD-10-CM | POA: Diagnosis not present

## 2019-02-09 DIAGNOSIS — M79671 Pain in right foot: Secondary | ICD-10-CM | POA: Diagnosis not present

## 2019-02-13 DIAGNOSIS — Z0000 Encounter for general adult medical examination without abnormal findings: Secondary | ICD-10-CM | POA: Diagnosis not present

## 2019-03-19 DIAGNOSIS — R509 Fever, unspecified: Secondary | ICD-10-CM | POA: Diagnosis not present

## 2019-03-20 DIAGNOSIS — J050 Acute obstructive laryngitis [croup]: Secondary | ICD-10-CM | POA: Diagnosis not present

## 2019-03-20 DIAGNOSIS — J989 Respiratory disorder, unspecified: Secondary | ICD-10-CM | POA: Diagnosis not present

## 2019-03-20 DIAGNOSIS — R509 Fever, unspecified: Secondary | ICD-10-CM | POA: Diagnosis not present

## 2019-03-31 DIAGNOSIS — Z30013 Encounter for initial prescription of injectable contraceptive: Secondary | ICD-10-CM | POA: Diagnosis not present

## 2019-04-01 DIAGNOSIS — Z3042 Encounter for surveillance of injectable contraceptive: Secondary | ICD-10-CM | POA: Diagnosis not present

## 2019-04-30 ENCOUNTER — Encounter (HOSPITAL_COMMUNITY): Payer: Self-pay

## 2019-04-30 ENCOUNTER — Ambulatory Visit (HOSPITAL_COMMUNITY)
Admission: EM | Admit: 2019-04-30 | Discharge: 2019-04-30 | Disposition: A | Discharge status: Home / Self Care | Payer: BLUE CROSS/BLUE SHIELD | Attending: Emergency Medicine | Admitting: Emergency Medicine

## 2019-04-30 ENCOUNTER — Other Ambulatory Visit: Payer: Self-pay

## 2019-04-30 DIAGNOSIS — O210 Mild hyperemesis gravidarum: Secondary | ICD-10-CM

## 2019-04-30 DIAGNOSIS — Z3201 Encounter for pregnancy test, result positive: Secondary | ICD-10-CM

## 2019-04-30 LAB — POCT PREGNANCY, URINE: Preg Test, Ur: POSITIVE — AB

## 2019-04-30 NOTE — ED Provider Notes (Signed)
[facility]    CSN: [id number] Arrival date & time: 04/30/19  [id number]     History   Chief Complaint Chief Complaint  Patient presents with  . Possible Pregnancy  . Nausea    HPI Brittney Olson is a 28 y.o. female.   Brittney Olson presents for confirmation of pregnancy test. She has had two positive tests as home, last was last night. She feels similar to when she was pregnant before, two years ago. Has had morning nausea and some vomiting. No fevers, no diarrhea. Has been able to eat and drink. No vaginal bleeding. States she is questioning the result because she receiving a depo injection April 8th. She did not have a pregnancy test prior to the injection. Her LMP was in March. She has prenatal vitamins at home. She doesn't take any medications, although she has used dramamine intermittently which does help with the nausea. She doesn't smoke. Without contributing medical history.      ROS per HPI, negative if not otherwise mentioned.      Past Medical History:  Diagnosis Date  . Anemia     Patient Active Problem List   Diagnosis Date Noted  . Indication for care in labor or delivery 12/12/2017    Past Surgical History:  Procedure Laterality Date  . NO PAST SURGERIES      OB History    Gravida  1   Para  1   Term  1   Preterm      AB      Living  1     SAB      TAB      Ectopic      Multiple  0   Live Births  1            Home Medications    Prior to Admission medications   Medication Sig Start Date End Date Taking? Authorizing Provider  ibuprofen (ADVIL,MOTRIN) 600 MG tablet Take 1 tablet (600 mg total) by mouth every 6 (six) hours as needed. 12/14/17   Gerald Leitzole, Tara, MD  Prenatal Vit-Fe Fumarate-FA (PRENATAL MULTIVITAMIN) TABS tablet Take 1 tablet by mouth daily at 12 noon.    [provider]  ranitidine (ZANTAC) 150 MG tablet Take 150 mg by mouth at bedtime.    [provider]     Family History Family History  Problem Relation Age of Onset  . Cancer Maternal Grandfather     Social History Social History   Tobacco Use  . Smoking status: Former Smoker    Last attempt to quit: 10/02/2013    Years since quitting: 5.5  . Smokeless tobacco: Never Used  . Tobacco comment: smoked socially   Substance Use Topics  . Alcohol use: No  . Drug use: No     Allergies   Patient has no known allergies.   Review of Systems Review of Systems   Physical Exam Triage Vital Signs ED Triage Vitals  Enc Vitals Group     BP 04/30/19 1007 112/69     Pulse Rate 04/30/19 1007 79     Resp 04/30/19 1007 18     Temp 04/30/19 1007 97.9 F (36.6 C)     Temp Source 04/30/19 1007 Oral     SpO2 04/30/19 1007 100 %     Weight 04/30/19 1005 120 lb (54.4 kg)     Height 04/30/19 1005 5\' 4"  (1.626 m)     Head Circumference --  Peak Flow --      Pain Score 04/30/19 1005 0     Pain Loc --      Pain Edu? --      Excl. in GC? --    No data found.  Updated Vital Signs BP 112/69 (BP Location: Left Arm)   Pulse 79   Temp 97.9 F (36.6 C) (Oral)   Resp 18   Ht 5\' 4"  (1.626 m)   Wt 120 lb (54.4 kg)   LMP 02/22/2019 (Exact Date)   SpO2 100%   BMI 20.60 kg/m    Physical Exam Constitutional:      General: She is not in acute distress.    Appearance: She is well-developed.  Cardiovascular:     Rate and Rhythm: Normal rate and regular rhythm.     Heart sounds: Normal heart sounds.  Pulmonary:     Effort: Pulmonary effort is normal.     Breath sounds: Normal breath sounds.  Abdominal:     Tenderness: There is no abdominal tenderness.  Skin:    General: Skin is warm and dry.  Neurological:     Mental Status: She is alert and oriented to person, place, and time.      UC Treatments / Results  Labs (all labs ordered are listed, but only abnormal results are displayed) Labs Reviewed  POCT PREGNANCY, URINE - Abnormal; Notable for the following components:       Result Value   Preg Test, Ur POSITIVE (*)    All other components within normal limits  POC URINE PREG, ED    EKG None  Radiology No results found.  Procedures Procedures (including critical care time)  Medications Ordered in UC Medications - No data to display  Initial Impression / Assessment and Plan / UC Course  I have reviewed the triage vital signs and the nursing notes.  Pertinent labs & imaging results that were available during my care of the patient were reviewed by me and considered in my medical decision making (see chart for details).     Positive pregnancy test here today. Uncertain of date related to depo in April, LMP was March. Will take prenatal daily. Encouraged follow up with OB. Patient verbalized understanding and agreeable to plan.    Final Clinical Impressions(s) / UC Diagnoses   Final diagnoses:  Positive pregnancy test  Morning sickness     Discharge Instructions     Your pregnancy test is positive today.  Please call your OB for follow up and dating, as Depo blurs the picture for dating of your pregnancy.  Take a prenatal vitamin daily.  See provided information about morning sickness.  Please go to the ER if you develop abdominal pain or vaginal bleeding.     ED Prescriptions    None     Controlled Substance Prescriptions [place] Controlled Substance Registry consulted? Not Applicable   Georgetta Haber, NP 04/30/19 1041

## 2019-04-30 NOTE — Discharge Instructions (Signed)
Your pregnancy test is positive today.  Please call your OB for follow up and dating, as Depo blurs the picture for dating of your pregnancy.  Take a prenatal vitamin daily.  See provided information about morning sickness.  Please go to the ER if you develop abdominal pain or vaginal bleeding.

## 2019-04-30 NOTE — ED Triage Notes (Signed)
Patient presents to Urgent Care with complaints of nausea and vomiting since 5 days ago. Patient reports she had two positive pregnancy tests at home. Pt states she got the depo shot a month ago to prevent pregnancy during COVID.

## 2019-05-06 DIAGNOSIS — Z3201 Encounter for pregnancy test, result positive: Secondary | ICD-10-CM | POA: Diagnosis not present

## 2019-05-06 DIAGNOSIS — Z3480 Encounter for supervision of other normal pregnancy, unspecified trimester: Secondary | ICD-10-CM | POA: Diagnosis not present

## 2019-05-06 LAB — OB RESULTS CONSOLE ABO/RH: RH Type: POSITIVE

## 2019-05-06 LAB — OB RESULTS CONSOLE ANTIBODY SCREEN: Antibody Screen: NEGATIVE

## 2019-05-06 LAB — OB RESULTS CONSOLE HEPATITIS B SURFACE ANTIGEN: Hepatitis B Surface Ag: NEGATIVE

## 2019-05-06 LAB — OB RESULTS CONSOLE RUBELLA ANTIBODY, IGM: Rubella: IMMUNE

## 2019-05-06 LAB — OB RESULTS CONSOLE GC/CHLAMYDIA
Chlamydia: NEGATIVE
Gonorrhea: NEGATIVE

## 2019-05-06 LAB — OB RESULTS CONSOLE RPR: RPR: NONREACTIVE

## 2019-05-06 LAB — OB RESULTS CONSOLE HIV ANTIBODY (ROUTINE TESTING): HIV: NONREACTIVE

## 2019-05-12 DIAGNOSIS — Z3687 Encounter for antenatal screening for uncertain dates: Secondary | ICD-10-CM | POA: Diagnosis not present

## 2019-05-26 ENCOUNTER — Other Ambulatory Visit (HOSPITAL_COMMUNITY)
Admission: RE | Admit: 2019-05-26 | Discharge: 2019-05-26 | Disposition: A | Discharge status: Home / Self Care | Payer: Medicaid Other | Source: Ambulatory Visit | Attending: Obstetrics and Gynecology | Admitting: Obstetrics and Gynecology

## 2019-05-26 ENCOUNTER — Other Ambulatory Visit: Payer: Self-pay | Admitting: Obstetrics and Gynecology

## 2019-05-26 DIAGNOSIS — Z3490 Encounter for supervision of normal pregnancy, unspecified, unspecified trimester: Secondary | ICD-10-CM | POA: Insufficient documentation

## 2019-05-28 LAB — CYTOLOGY - PAP
Chlamydia: NEGATIVE
Diagnosis: NEGATIVE
Neisseria Gonorrhea: NEGATIVE

## 2019-11-22 LAB — OB RESULTS CONSOLE GBS: GBS: NEGATIVE

## 2019-11-25 ENCOUNTER — Encounter (HOSPITAL_COMMUNITY): Payer: Self-pay | Admitting: *Deleted

## 2019-11-25 ENCOUNTER — Other Ambulatory Visit: Payer: Self-pay

## 2019-11-25 ENCOUNTER — Inpatient Hospital Stay (HOSPITAL_COMMUNITY)
Admission: AD | Admit: 2019-11-25 | Discharge: 2019-11-25 | Disposition: A | Discharge status: Home / Self Care | Payer: Medicaid Other | Attending: Obstetrics & Gynecology | Admitting: Obstetrics & Gynecology

## 2019-11-25 DIAGNOSIS — O26893 Other specified pregnancy related conditions, third trimester: Secondary | ICD-10-CM | POA: Diagnosis not present

## 2019-11-25 DIAGNOSIS — Z87891 Personal history of nicotine dependence: Secondary | ICD-10-CM | POA: Insufficient documentation

## 2019-11-25 DIAGNOSIS — R0602 Shortness of breath: Secondary | ICD-10-CM | POA: Insufficient documentation

## 2019-11-25 DIAGNOSIS — Z3A36 36 weeks gestation of pregnancy: Secondary | ICD-10-CM | POA: Insufficient documentation

## 2019-11-25 DIAGNOSIS — R42 Dizziness and giddiness: Secondary | ICD-10-CM | POA: Diagnosis present

## 2019-11-25 DIAGNOSIS — Z3689 Encounter for other specified antenatal screening: Secondary | ICD-10-CM

## 2019-11-25 LAB — URINALYSIS, ROUTINE W REFLEX MICROSCOPIC
Bilirubin Urine: NEGATIVE
Glucose, UA: 150 mg/dL — AB
Hgb urine dipstick: NEGATIVE
Ketones, ur: 5 mg/dL — AB
Nitrite: NEGATIVE
Protein, ur: NEGATIVE mg/dL
Specific Gravity, Urine: 1.013 (ref 1.005–1.030)
pH: 7 (ref 5.0–8.0)

## 2019-11-25 LAB — COMPREHENSIVE METABOLIC PANEL
ALT: 11 U/L (ref 0–44)
AST: 19 U/L (ref 15–41)
Albumin: 2.9 g/dL — ABNORMAL LOW (ref 3.5–5.0)
Alkaline Phosphatase: 177 U/L — ABNORMAL HIGH (ref 38–126)
Anion gap: 8 (ref 5–15)
BUN: 6 mg/dL (ref 6–20)
CO2: 22 mmol/L (ref 22–32)
Calcium: 8.4 mg/dL — ABNORMAL LOW (ref 8.9–10.3)
Chloride: 106 mmol/L (ref 98–111)
Creatinine, Ser: 0.51 mg/dL (ref 0.44–1.00)
GFR calc Af Amer: >60 mL/min (ref >60)
GFR calc non Af Amer: >60 mL/min (ref >60)
Glucose, Bld: 120 mg/dL — ABNORMAL HIGH (ref 70–99)
Potassium: 3.5 mmol/L (ref 3.5–5.1)
Sodium: 136 mmol/L (ref 135–145)
Total Bilirubin: 0.3 mg/dL (ref 0.3–1.2)
Total Protein: 6.2 g/dL — ABNORMAL LOW (ref 6.5–8.1)

## 2019-11-25 LAB — CBC
HCT: 32.3 % — ABNORMAL LOW (ref 36.0–46.0)
Hemoglobin: 10.3 g/dL — ABNORMAL LOW (ref 12.0–15.0)
MCH: 24.9 pg — ABNORMAL LOW (ref 26.0–34.0)
MCHC: 31.9 g/dL (ref 30.0–36.0)
MCV: 78 fL — ABNORMAL LOW (ref 80.0–100.0)
Platelets: 329 10*3/uL (ref 150–400)
RBC: 4.14 MIL/uL (ref 3.87–5.11)
RDW: 13.4 % (ref 11.5–15.5)
WBC: 10.3 10*3/uL (ref 4.0–10.5)
nRBC: 0 % (ref 0.0–0.2)

## 2019-11-25 NOTE — Discharge Instructions (Signed)

## 2019-11-25 NOTE — MAU Provider Note (Signed)
History     CSN: [id number]  Arrival date and time: 11/25/19 1208   First Provider Initiated Contact with Patient 11/25/19 1241      Chief Complaint  Patient presents with  . Dizziness  . Blurred Vision  . numb hands   HPI Brittney Olson is a 28 y.o. G2P1001 at 6668w6d who presents to MAU with chief complaint of new onset blurry vision followed by SOB, numbness in her hands and sense of feeling sleepy and dizzy.   Patient states she woke up at 6, had breakfast of Cookout fries and chicken tenders around 10:30am then experienced the aforementioned symptoms almost immediately while sitting in her car. She endorses drinking about 3 bottles of water so far today.  Patient states she hasn't slept for more than a few hours in the past month. She also reports that she has been painting her kitchen while her husband is at work for the past two days.  Patient's husband also offers that she takes frequent and prolonged walks around the neighborhood.  She denies headache, migraine, visual disturbances, weakness, syncope, recent illness or fever.  She denies contraction pain, vaginal bleeding, leaking of fluid, decreased fetal movement, fever, falls, or recent illness.   Next appt in clinic is Tuesday 11/30/19 at [facility].  OB History    Gravida  2   Para  1   Term  1   Preterm      AB      Living  1     SAB      TAB      Ectopic      Multiple  0   Live Births  1           Past Medical History:  Diagnosis Date  . Anemia     Past Surgical History:  Procedure Laterality Date  . NO PAST SURGERIES      Family History  Problem Relation Age of Onset  . Cancer Maternal Grandfather     Social History   Tobacco Use  . Smoking status: Former Smoker    Quit date: 10/02/2013    Years since quitting: 6.1  . Smokeless tobacco: Never Used  . Tobacco comment: smoked socially   Substance Use Topics  . Alcohol use: No  . Drug use: No    Allergies: No Known  Allergies  Medications Prior to Admission  Medication Sig Dispense Refill Last Dose  . Prenatal Vit-Fe Fumarate-FA (PRENATAL MULTIVITAMIN) TABS tablet Take 1 tablet by mouth daily at 12 noon.   11/25/2019 at Unknown time  . ibuprofen (ADVIL,MOTRIN) 600 MG tablet Take 1 tablet (600 mg total) by mouth every 6 (six) hours as needed. 30 tablet 0 More than a month at Unknown time  . ranitidine (ZANTAC) 150 MG tablet Take 150 mg by mouth at bedtime.   More than a month at Unknown time    Review of Systems  Constitutional: Negative for chills, fatigue and fever.  Eyes: Negative for photophobia and visual disturbance.  Respiratory: Positive for shortness of breath.   Cardiovascular: Negative for chest pain and palpitations.  Gastrointestinal: Negative for abdominal pain.  Genitourinary: Negative for vaginal bleeding.  Musculoskeletal: Negative for back pain.  Neurological: Positive for dizziness. Negative for syncope, weakness and headaches.  All other systems reviewed and are negative.  Physical Exam   Blood pressure 102/76, pulse 100, temperature 98.3 F (36.8 C), temperature source Oral, resp. rate 20, last menstrual period 02/22/2019, SpO2 99 %, unknown if  currently breastfeeding.  Physical Exam  Nursing note and vitals reviewed. Constitutional: She is oriented to person, place, and time. She appears well-developed and well-nourished. She appears lethargic.  A&O x 4. Movements are voluntary and symmetrical.   Cardiovascular: Normal rate.  Respiratory: Effort normal and breath sounds normal. No respiratory distress.  GI: Soft. She exhibits no distension. There is no abdominal tenderness. There is no rebound, no guarding and no CVA tenderness.  Gravid  Neurological: She is oriented to person, place, and time. She has normal strength. She appears lethargic. No cranial nerve deficit or sensory deficit. She displays a negative Romberg sign.  Skin: Skin is warm and dry.  Psychiatric: She has  a normal mood and affect. Her behavior is normal. Judgment and thought content normal.    MAU Course/MDM  Procedures  --S/p normal CBC, CMP, EKG in MAU --Prenatal records visible in Media tab through end of September. Patient reported dizziness and intermittent SOB on 09/16/19 per prenatal record --Symptoms resolving without intervention during evaluation in MAU --Discussed postural hypotension vs hemodilution. Discussed hemoglobin of 10.3 today. Advised patient to discuss possible Fe supplement with primary [facility] Provider at her next visit, especially if she has additional episodes --Addressed long-term influence of sleep deprivation, near-term pregnancy, patient's report of painting her kitchen alone for past two days as representing too much physical demand at this point in pregnancy --Reactive fetal tracing: baseline 125, mod var, + accels, no decels --Toco: occasional contractions, not felt by patient  Patient Vitals for the past 24 hrs:  BP Temp Temp src Pulse Resp SpO2  11/25/19 1404 99/62 - - 78 - -  11/25/19 1401 - - - 82 18 -  11/25/19 1237 102/76 - - 100 - -  11/25/19 1224 - - - - - 99 %  11/25/19 1223 109/76 98.3 F (36.8 C) Oral 98 20 99 %   Results for orders placed or performed during the hospital encounter of 11/25/19 (from the past 24 hour(s))  Urinalysis, Routine w reflex microscopic     Status: Abnormal   Collection Time: 11/25/19 12:28 PM  Result Value Ref Range   Color, Urine YELLOW YELLOW   APPearance HAZY (A) CLEAR   Specific Gravity, Urine 1.013 1.005 - 1.030   pH 7.0 5.0 - 8.0   Glucose, UA 150 (A) NEGATIVE mg/dL   Hgb urine dipstick NEGATIVE NEGATIVE   Bilirubin Urine NEGATIVE NEGATIVE   Ketones, ur 5 (A) NEGATIVE mg/dL   Protein, ur NEGATIVE NEGATIVE mg/dL   Nitrite NEGATIVE NEGATIVE   Leukocytes,Ua SMALL (A) NEGATIVE   RBC / HPF 0-5 0 - 5 RBC/hpf   WBC, UA 6-10 0 - 5 WBC/hpf   Bacteria, UA FEW (A) NONE SEEN   Squamous Epithelial / LPF 11-20 0 - 5    Mucus PRESENT   CBC     Status: Abnormal   Collection Time: 11/25/19 12:50 PM  Result Value Ref Range   WBC 10.3 4.0 - 10.5 K/uL   RBC 4.14 3.87 - 5.11 MIL/uL   Hemoglobin 10.3 (L) 12.0 - 15.0 g/dL   HCT 91.4 (L) 78.2 - 95.6 %   MCV 78.0 (L) 80.0 - 100.0 fL   MCH 24.9 (L) 26.0 - 34.0 pg   MCHC 31.9 30.0 - 36.0 g/dL   RDW 21.3 08.6 - 57.8 %   Platelets 329 150 - 400 K/uL   nRBC 0.0 0.0 - 0.2 %  Comprehensive metabolic panel     Status: Abnormal   Collection  Time: 11/25/19 12:50 PM  Result Value Ref Range   Sodium 136 135 - 145 mmol/L   Potassium 3.5 3.5 - 5.1 mmol/L   Chloride 106 98 - 111 mmol/L   CO2 22 22 - 32 mmol/L   Glucose, Bld 120 (H) 70 - 99 mg/dL   BUN 6 6 - 20 mg/dL   Creatinine, Ser 0.51 0.44 - 1.00 mg/dL   Calcium 8.4 (L) 8.9 - 10.3 mg/dL   Total Protein 6.2 (L) 6.5 - 8.1 g/dL   Albumin 2.9 (L) 3.5 - 5.0 g/dL   AST 19 15 - 41 U/L   ALT 11 0 - 44 U/L   Alkaline Phosphatase 177 (H) 38 - 126 U/L   Total Bilirubin 0.3 0.3 - 1.2 mg/dL   GFR calc non Af Amer >60 >60 mL/min   GFR calc Af Amer >60 >60 mL/min   Anion gap 8 5 - 15    Assessment and Plan  --28 y.o. G2P1001 at [redacted]w[redacted]d  --Reactive tracing --Stable and asymptomatic throughout evaluation in MAU --No concerning findings on labs or physical exam today --Discharge home in stable condition  F/U: --Patient has appointment with [facility] 11/30/2019  Darlina Rumpf, CNM 11/25/2019, 2:35 PM

## 2019-11-25 NOTE — MAU Note (Signed)
About an hour ago, her hands started going numb, vision started blurring.  Then she got really sleepy and dizzy. Denies any pain.  Denies HA, epigastric pain or swelling. Was feeling SOB about 30 min ago, has resolved, that what when she started feeling dizzy. Denies cough or sore throat.

## 2019-11-26 LAB — CULTURE, OB URINE

## 2019-12-06 ENCOUNTER — Encounter (HOSPITAL_COMMUNITY): Payer: Self-pay | Admitting: *Deleted

## 2019-12-06 ENCOUNTER — Telehealth (HOSPITAL_COMMUNITY): Payer: Self-pay | Admitting: *Deleted

## 2019-12-06 NOTE — Telephone Encounter (Signed)
Preadmission screen  

## 2019-12-13 ENCOUNTER — Telehealth (HOSPITAL_COMMUNITY): Payer: Self-pay | Admitting: *Deleted

## 2019-12-13 NOTE — Telephone Encounter (Signed)
Preadmission screen Spoke with her husband,  Informed him of Covid test appt.  Michela Pitcher he would bring her.  They have an OB appointment tomorrow and will call if they have further questions.

## 2019-12-14 ENCOUNTER — Telehealth (HOSPITAL_COMMUNITY): Payer: Self-pay | Admitting: *Deleted

## 2019-12-14 NOTE — Telephone Encounter (Signed)
Preadmission screen  

## 2019-12-18 ENCOUNTER — Other Ambulatory Visit: Payer: Self-pay | Admitting: Obstetrics and Gynecology

## 2019-12-18 ENCOUNTER — Other Ambulatory Visit (HOSPITAL_COMMUNITY)
Admission: RE | Admit: 2019-12-18 | Discharge: 2019-12-18 | Disposition: A | Discharge status: Home / Self Care | Payer: Medicaid Other | Source: Ambulatory Visit | Attending: Obstetrics and Gynecology | Admitting: Obstetrics and Gynecology

## 2019-12-18 DIAGNOSIS — Z20828 Contact with and (suspected) exposure to other viral communicable diseases: Secondary | ICD-10-CM | POA: Diagnosis not present

## 2019-12-18 DIAGNOSIS — Z01812 Encounter for preprocedural laboratory examination: Secondary | ICD-10-CM | POA: Insufficient documentation

## 2019-12-18 LAB — SARS CORONAVIRUS 2 (TAT 6-24 HRS): SARS Coronavirus 2: NEGATIVE

## 2019-12-18 NOTE — H&P (Deleted)
  The note originally documented on this encounter has been moved the the encounter in which it belongs.  

## 2019-12-18 NOTE — H&P (Signed)
Brittney Olson is a 28 y.o. female G2 P1001 at 40 wks and 3 days presenting for induction of labor. Pregnancy has been uncomplicated. Prenatal care provided by Dr. Christophe Louis with [facility].   . OB History    Gravida  2   Para  1   Term  1   Preterm      AB      Living  1     SAB      TAB      Ectopic      Multiple  0   Live Births  1          Past Medical History:  Diagnosis Date  . Anemia    Past Surgical History:  Procedure Laterality Date  . NO PAST SURGERIES     Family History: family history includes Cancer in her maternal grandfather; Hypertension in her maternal grandmother; Kidney disease in her maternal uncle; Lupus in her maternal aunt. Social History:  reports that she quit smoking about 6 years ago. She has never used smokeless tobacco. She reports that she does not drink alcohol or use drugs.     Maternal Diabetes: No Genetic Screening: Normal Maternal Ultrasounds/Referrals: Normal Fetal Ultrasounds or other Referrals:  None Maternal Substance Abuse:  No Significant Maternal Medications:  None Significant Maternal Lab Results:  Group B Strep negative Other Comments:  None  Review of Systems  Constitutional: Negative.   HENT: Negative.   Eyes: Negative.   Respiratory: Negative.   Cardiovascular: Negative.   Gastrointestinal: Negative.   Endocrine: Negative.   Genitourinary: Negative.   Musculoskeletal: Negative.   Allergic/Immunologic: Negative.   Neurological: Negative.   Hematological: Negative.   Psychiatric/Behavioral: Negative.    History   Last menstrual period 02/22/2019, unknown if currently breastfeeding. Exam Physical Exam  Vitals reviewed. Constitutional: She is oriented to person, place, and time. She appears well-developed and well-nourished.  HENT:  Head: Normocephalic and atraumatic.  Eyes: Pupils are equal, round, and reactive to light. Conjunctivae are normal.  Cardiovascular: Normal rate and regular rhythm.   Respiratory: Effort normal and breath sounds normal.  GI: There is no abdominal tenderness.  Genitourinary:    Vagina normal.   Musculoskeletal:        General: Normal range of motion.     Cervical back: Normal range of motion and neck supple.  Neurological: She is alert and oriented to person, place, and time.  Skin: Skin is warm and dry.  Psychiatric: She has a normal mood and affect.    Prenatal labs: ABO, Rh: B/Positive/-- (05/14 0000) Antibody: Negative (05/14 0000) Rubella: Immune (05/14 0000) RPR: Nonreactive (05/14 0000)  HBsAg: Negative (05/14 0000)  HIV: Non-reactive (05/14 0000)  GBS: Negative/-- (11/30 0000)   Assessment/Plan: 40 wks and 3 days for induction of labor due to post dates.  Cytotec for cervical ripening.  Anticipate SVD   Christophe Louis 12/18/2019, 4:01 PM

## 2019-12-20 ENCOUNTER — Inpatient Hospital Stay (HOSPITAL_COMMUNITY): Payer: Medicaid Other | Admitting: Anesthesiology

## 2019-12-20 ENCOUNTER — Encounter (HOSPITAL_COMMUNITY): Payer: Self-pay | Admitting: Obstetrics and Gynecology

## 2019-12-20 ENCOUNTER — Inpatient Hospital Stay (HOSPITAL_COMMUNITY): Payer: Medicaid Other

## 2019-12-20 ENCOUNTER — Inpatient Hospital Stay (HOSPITAL_COMMUNITY)
Admission: AD | Admit: 2019-12-20 | Discharge: 2019-12-22 | DRG: 807 | Disposition: A | Discharge status: Home / Self Care | Payer: Medicaid Other | Attending: Obstetrics and Gynecology | Admitting: Obstetrics and Gynecology

## 2019-12-20 DIAGNOSIS — D649 Anemia, unspecified: Secondary | ICD-10-CM | POA: Diagnosis present

## 2019-12-20 DIAGNOSIS — O9902 Anemia complicating childbirth: Secondary | ICD-10-CM | POA: Diagnosis present

## 2019-12-20 DIAGNOSIS — Z87891 Personal history of nicotine dependence: Secondary | ICD-10-CM | POA: Diagnosis not present

## 2019-12-20 DIAGNOSIS — O480 Post-term pregnancy: Principal | ICD-10-CM | POA: Diagnosis present

## 2019-12-20 DIAGNOSIS — Z3A40 40 weeks gestation of pregnancy: Secondary | ICD-10-CM

## 2019-12-20 LAB — ABO/RH: ABO/RH(D): B POS

## 2019-12-20 LAB — CBC
HCT: 33.4 % — ABNORMAL LOW (ref 36.0–46.0)
Hemoglobin: 10.4 g/dL — ABNORMAL LOW (ref 12.0–15.0)
MCH: 24 pg — ABNORMAL LOW (ref 26.0–34.0)
MCHC: 31.1 g/dL (ref 30.0–36.0)
MCV: 77 fL — ABNORMAL LOW (ref 80.0–100.0)
Platelets: 344 10*3/uL (ref 150–400)
RBC: 4.34 MIL/uL (ref 3.87–5.11)
RDW: 15.1 % (ref 11.5–15.5)
WBC: 11.1 10*3/uL — ABNORMAL HIGH (ref 4.0–10.5)
nRBC: 0 % (ref 0.0–0.2)

## 2019-12-20 LAB — TYPE AND SCREEN
ABO/RH(D): B POS
Antibody Screen: NEGATIVE

## 2019-12-20 LAB — RPR: RPR Ser Ql: NONREACTIVE

## 2019-12-20 MED ORDER — OXYTOCIN BOLUS FROM INFUSION
500.0000 mL | Freq: Once | INTRAVENOUS | Status: AC
  Administered 2019-12-20: 500 mL via INTRAVENOUS

## 2019-12-20 MED ORDER — PHENYLEPHRINE 40 MCG/ML (10ML) SYRINGE FOR IV PUSH (FOR BLOOD PRESSURE SUPPORT): 80.0000 ug | PREFILLED_SYRINGE | INTRAVENOUS | Status: DC | PRN

## 2019-12-20 MED ORDER — LACTATED RINGERS IV SOLN
500.0000 mL | INTRAVENOUS | Status: DC | PRN
  Administered 2019-12-20: 05:00:00 500 mL via INTRAVENOUS

## 2019-12-20 MED ORDER — OXYCODONE-ACETAMINOPHEN 5-325 MG PO TABS: 2.0000 | ORAL_TABLET | ORAL | Status: DC | PRN

## 2019-12-20 MED ORDER — DIPHENHYDRAMINE HCL 25 MG PO CAPS: 25.0000 mg | ORAL_CAPSULE | Freq: Four times a day (QID) | ORAL | Status: DC | PRN

## 2019-12-20 MED ORDER — IBUPROFEN 600 MG PO TABS
600.0000 mg | ORAL_TABLET | Freq: Four times a day (QID) | ORAL | Status: DC
  Administered 2019-12-20 – 2019-12-22 (×8): 600 mg via ORAL
  Filled 2019-12-20 (×8): qty 1

## 2019-12-20 MED ORDER — OXYTOCIN 40 UNITS IN NORMAL SALINE INFUSION - SIMPLE MED
2.5000 [IU]/h | INTRAVENOUS | Status: DC
  Administered 2019-12-20: 12:00:00 2.5 [IU]/h via INTRAVENOUS
  Filled 2019-12-20: qty 1000

## 2019-12-20 MED ORDER — FENTANYL CITRATE (PF) 100 MCG/2ML IJ SOLN: 50.0000 ug | INTRAMUSCULAR | Status: DC | PRN

## 2019-12-20 MED ORDER — EPHEDRINE 5 MG/ML INJ: 10.0000 mg | INTRAVENOUS | Status: DC | PRN

## 2019-12-20 MED ORDER — TERBUTALINE SULFATE 1 MG/ML IJ SOLN: 0.2500 mg | Freq: Once | INTRAMUSCULAR | Status: DC | PRN

## 2019-12-20 MED ORDER — SENNOSIDES-DOCUSATE SODIUM 8.6-50 MG PO TABS
2.0000 | ORAL_TABLET | ORAL | Status: DC
  Administered 2019-12-20 – 2019-12-22 (×2): 2 via ORAL
  Filled 2019-12-20 (×2): qty 2

## 2019-12-20 MED ORDER — WITCH HAZEL-GLYCERIN EX PADS: 1.0000 "application " | MEDICATED_PAD | CUTANEOUS | Status: DC | PRN

## 2019-12-20 MED ORDER — OXYCODONE-ACETAMINOPHEN 5-325 MG PO TABS: 1.0000 | ORAL_TABLET | ORAL | Status: DC | PRN

## 2019-12-20 MED ORDER — LACTATED RINGERS IV SOLN: INTRAVENOUS | Status: DC

## 2019-12-20 MED ORDER — ACETAMINOPHEN 325 MG PO TABS: 650.0000 mg | ORAL_TABLET | ORAL | Status: DC | PRN

## 2019-12-20 MED ORDER — PRENATAL MULTIVITAMIN CH
1.0000 | ORAL_TABLET | Freq: Every day | ORAL | Status: DC
  Administered 2019-12-20 – 2019-12-22 (×3): 1 via ORAL
  Filled 2019-12-20 (×3): qty 1

## 2019-12-20 MED ORDER — SODIUM CHLORIDE (PF) 0.9 % IJ SOLN
INTRAMUSCULAR | Status: DC | PRN
  Administered 2019-12-20: 12 mL/h via EPIDURAL

## 2019-12-20 MED ORDER — METHYLERGONOVINE MALEATE 0.2 MG/ML IJ SOLN: 0.2000 mg | INTRAMUSCULAR | Status: DC | PRN

## 2019-12-20 MED ORDER — SOD CITRATE-CITRIC ACID 500-334 MG/5ML PO SOLN: 30.0000 mL | ORAL | Status: DC | PRN

## 2019-12-20 MED ORDER — MISOPROSTOL 25 MCG QUARTER TABLET
25.0000 ug | ORAL_TABLET | ORAL | Status: DC | PRN
  Administered 2019-12-20: 25 ug via VAGINAL
  Filled 2019-12-20: qty 1

## 2019-12-20 MED ORDER — ONDANSETRON HCL 4 MG/2ML IJ SOLN: 4.0000 mg | Freq: Four times a day (QID) | INTRAMUSCULAR | Status: DC | PRN

## 2019-12-20 MED ORDER — HYDROXYZINE HCL 50 MG PO TABS: 50.0000 mg | ORAL_TABLET | Freq: Four times a day (QID) | ORAL | Status: DC | PRN

## 2019-12-20 MED ORDER — FENTANYL-BUPIVACAINE-NACL 0.5-0.125-0.9 MG/250ML-% EP SOLN
12.0000 mL/h | EPIDURAL | Status: DC | PRN
  Filled 2019-12-20: qty 250

## 2019-12-20 MED ORDER — OXYTOCIN 40 UNITS IN NORMAL SALINE INFUSION - SIMPLE MED
1.0000 m[IU]/min | INTRAVENOUS | Status: DC
  Administered 2019-12-20: 08:00:00 2 m[IU]/min via INTRAVENOUS

## 2019-12-20 MED ORDER — SIMETHICONE 80 MG PO CHEW: 80.0000 mg | CHEWABLE_TABLET | ORAL | Status: DC | PRN

## 2019-12-20 MED ORDER — COCONUT OIL OIL: 1.0000 "application " | TOPICAL_OIL | Status: DC | PRN

## 2019-12-20 MED ORDER — DIPHENHYDRAMINE HCL 50 MG/ML IJ SOLN: 12.5000 mg | INTRAMUSCULAR | Status: DC | PRN

## 2019-12-20 MED ORDER — ONDANSETRON HCL 4 MG/2ML IJ SOLN: 4.0000 mg | INTRAMUSCULAR | Status: DC | PRN

## 2019-12-20 MED ORDER — DIBUCAINE (PERIANAL) 1 % EX OINT: 1.0000 "application " | TOPICAL_OINTMENT | CUTANEOUS | Status: DC | PRN

## 2019-12-20 MED ORDER — LIDOCAINE HCL (PF) 1 % IJ SOLN
30.0000 mL | INTRAMUSCULAR | Status: AC | PRN
  Administered 2019-12-20: 7 mL via SUBCUTANEOUS
  Administered 2019-12-20: 10:00:00 5 mL via SUBCUTANEOUS

## 2019-12-20 MED ORDER — LACTATED RINGERS IV SOLN: 500.0000 mL | Freq: Once | INTRAVENOUS | Status: DC

## 2019-12-20 MED ORDER — ONDANSETRON HCL 4 MG PO TABS: 4.0000 mg | ORAL_TABLET | ORAL | Status: DC | PRN

## 2019-12-20 MED ORDER — ZOLPIDEM TARTRATE 5 MG PO TABS: 5.0000 mg | ORAL_TABLET | Freq: Every evening | ORAL | Status: DC | PRN

## 2019-12-20 MED ORDER — BENZOCAINE-MENTHOL 20-0.5 % EX AERO: 1.0000 "application " | INHALATION_SPRAY | CUTANEOUS | Status: DC | PRN

## 2019-12-20 MED ORDER — METHYLERGONOVINE MALEATE 0.2 MG PO TABS: 0.2000 mg | ORAL_TABLET | ORAL | Status: DC | PRN

## 2019-12-20 NOTE — Anesthesia Preprocedure Evaluation (Signed)
Anesthesia Evaluation  Patient identified by MRN, date of birth, ID band Patient awake    Reviewed: Allergy & Precautions, H&P , NPO status , Patient's Chart, lab work & pertinent test results  Airway Mallampati: I  TM Distance: >3 FB Neck ROM: full    Dental no notable dental hx. (+) Teeth Intact   Pulmonary former smoker,    Pulmonary exam normal breath sounds clear to auscultation       Cardiovascular negative cardio ROS Normal cardiovascular exam Rhythm:regular Rate:Normal     Neuro/Psych negative neurological ROS  negative psych ROS   GI/Hepatic negative GI ROS, Neg liver ROS,   Endo/Other  negative endocrine ROS  Renal/GU negative Renal ROS  negative genitourinary   Musculoskeletal   Abdominal Normal abdominal exam  (+)   Peds  Hematology  (+) Blood dyscrasia, anemia ,   Anesthesia Other Findings   Reproductive/Obstetrics (+) Pregnancy                             Anesthesia Physical Anesthesia Plan  ASA: II  Anesthesia Plan: Epidural   Post-op Pain Management:    Induction:   PONV Risk Score and Plan:   Airway Management Planned:   Additional Equipment:   Intra-op Plan:   Post-operative Plan:   Informed Consent: I have reviewed the patients History and Physical, chart, labs and discussed the procedure including the risks, benefits and alternatives for the proposed anesthesia with the patient or authorized representative who has indicated his/her understanding and acceptance.       Plan Discussed with:   Anesthesia Plan Comments:         Anesthesia Quick Evaluation

## 2019-12-20 NOTE — Lactation Note (Signed)
This note was copied from a baby's chart. Lactation Consultation Note  Patient Name: Brittney Olson [id number] Date: 12/20/2019 Reason for consult: Initial assessment;Term  I conducted an initial consult with Brittney Olson, a P2 who has experience with breast feeding. She has now breast fed her 50 hour old daughter, Brittney Olson, 3+ times. She feels that Brittney Olson is latching well and states that she has a strong suck at the breast.   While in the room, Brittney Olson began to cue. I observed Brittney Olson latch her to the right breast in cradle hold. I noted that baby latched well to her everted nipples; she did release the breast occasionally and then root again.  I showed Brittney Olson how to "sandwich" the breast to help baby achieve a deeper latch. This technique may need some reinforcement as I noted that she tended to hold Brittney Olson in cradle and sandwich the breast in a "C" hold vs. The recommended "U" hold.  I conducted basic breast feeding education and shared the [facility] guide details with Brittney Olson. I recommended breast feeding on demand 8-12 times a day and waking baby to feed, as needed. I reviewed output expectations for day 1. Baby has not had a wet or dirty diaper as of this visit.   Brittney Olson states that she is able to hand express. She also has a personal pump at home. She knows how to use her pump; however, she is not planning to return to work with Brittney Olson, and she is hopeful that she will only need to pump occasionally. She had difficulty maintaining her milk volume after returning to work with her previous child.  Maternal Data Formula Feeding for Exclusion: Yes Reason for exclusion: Mother's choice to formula and breast feed on admission Has patient been taught Hand Expression?: Yes Does the patient have breastfeeding experience prior to this delivery?: Yes  Feeding Feeding Type: Breast Fed  LATCH Score Latch: Grasps breast easily, tongue down, lips flanged, rhythmical sucking.  Audible Swallowing: A few with  stimulation  Type of Nipple: Everted at rest and after stimulation  Comfort (Breast/Nipple): Soft / non-tender  Hold (Positioning): Assistance needed to correctly position infant at breast and maintain latch.  LATCH Score: 8  Interventions Interventions: Breast feeding basics reviewed   Consult Status Consult Status: Follow-up Date: 12/21/19 Follow-up type: In-patient    Lenore Manner 12/20/2019, 8:48 PM

## 2019-12-20 NOTE — Anesthesia Procedure Notes (Signed)
Epidural Patient location during procedure: OB Start time: 12/20/2019 9:27 AM End time: 12/20/2019 9:30 AM  Staffing Anesthesiologist: Lyn Hollingshead, MD Performed: anesthesiologist   Preanesthetic Checklist Completed: patient identified, IV checked, site marked, risks and benefits discussed, surgical consent, monitors and equipment checked, pre-op evaluation and timeout performed  Epidural Patient position: sitting Prep: DuraPrep and site prepped and draped Patient monitoring: continuous pulse ox and blood pressure Approach: midline Location: L3-L4 Injection technique: LOR air  Needle:  Needle type: Tuohy  Needle gauge: 17 G Needle length: 9 cm and 9 Needle insertion depth: 6 cm Catheter type: closed end flexible Catheter size: 19 Gauge Catheter at skin depth: 11 cm Test dose: negative and Other  Assessment Events: blood not aspirated, injection not painful, no injection resistance, no paresthesia and negative IV test  Additional Notes Reason for block:procedure for pain

## 2019-12-20 NOTE — Progress Notes (Signed)
Brittney Olson is a 28 y.o. G2P1001 at [redacted]w[redacted]d by LMP admitted for induction of labor due to Post dates. Due date 12/17/19.  Subjective: Patient rates contractions 7 out of 10 no lof .. reports some bloody show  Objective: BP 102/75   Pulse 75   Temp 98.5 F (36.9 C) (Oral)   Resp 15   Ht 5\' 4"  (1.626 m)   Wt 62 kg   LMP 02/22/2019 (Exact Date)   BMI 23.46 kg/m  No intake/output data recorded. No intake/output data recorded.  FHT:  FHR: 130 bpm, variability: moderate,  accelerations:  Present,  decelerations:  Absent UC:   regular, every 2 minutes SVE:   Dilation: 3.5 Effacement (%): 50 Station: -2 Exam by:: Dr. Landry Mellow   Labs: Lab Results  Component Value Date   WBC 11.1 (H) 12/20/2019   HGB 10.4 (L) 12/20/2019   HCT 33.4 (L) 12/20/2019   MCV 77.0 (L) 12/20/2019   PLT 344 12/20/2019    Assessment / Plan: induction due to post dates. ---  Labor: start pitocin 2x2 Preeclampsia:  NA Fetal Wellbeing:  Category I Pain Control:  Labor support without medications I/D:  n/a Anticipated MOD:  NSVD  Christophe Louis 12/20/2019, 7:35 AM

## 2019-12-21 LAB — CBC
HCT: 30.4 % — ABNORMAL LOW (ref 36.0–46.0)
Hemoglobin: 9.5 g/dL — ABNORMAL LOW (ref 12.0–15.0)
MCH: 23.8 pg — ABNORMAL LOW (ref 26.0–34.0)
MCHC: 31.3 g/dL (ref 30.0–36.0)
MCV: 76.2 fL — ABNORMAL LOW (ref 80.0–100.0)
Platelets: 264 10*3/uL (ref 150–400)
RBC: 3.99 MIL/uL (ref 3.87–5.11)
RDW: 15.3 % (ref 11.5–15.5)
WBC: 11.7 10*3/uL — ABNORMAL HIGH (ref 4.0–10.5)
nRBC: 0 % (ref 0.0–0.2)

## 2019-12-21 NOTE — Progress Notes (Signed)
Post Partum Day 1 SVD Subjective: no complaints, up ad lib, voiding, tolerating PO and + flatus  Objective: Blood pressure 100/66, pulse 67, temperature 98.3 F (36.8 C), temperature source Oral, resp. rate 18, height 5\' 4"  (1.626 m), weight 62 kg, last menstrual period 02/22/2019, SpO2 99 %, unknown if currently breastfeeding.  Physical Exam:  General: alert, cooperative and no distress Lochia: appropriate Uterine Fundus: firm Incision: NA DVT Evaluation: No evidence of DVT seen on physical exam.  Recent Labs    12/20/19 0037 12/21/19 0641  HGB 10.4* 9.5*  HCT 33.4* 30.4*    Assessment/Plan: Plan for discharge tomorrow and Breastfeeding  Routine postpartum care    LOS: 1 day   Brittney Olson 12/21/2019, 4:49 PM

## 2019-12-21 NOTE — Anesthesia Postprocedure Evaluation (Signed)
Anesthesia Post Note  Patient: Shyia Fillingim  Procedure(s) Performed: AN AD HOC LABOR EPIDURAL     Patient location during evaluation: Mother Baby Anesthesia Type: Epidural Level of consciousness: awake and alert Pain management: pain level controlled Vital Signs Assessment: post-procedure vital signs reviewed and stable Respiratory status: spontaneous breathing, nonlabored ventilation and respiratory function stable Cardiovascular status: stable Postop Assessment: no headache, no backache and epidural receding Anesthetic complications: no Comments: Per telephone conversation    Last Vitals:  Vitals:   12/21/19 0246 12/21/19 0625  BP: 101/68 116/75  Pulse: 66 61  Resp: 16 18  Temp: 36.8 C 36.7 C  SpO2: 100% 99%    Last Pain:  Vitals:   12/21/19 0625  TempSrc: Axillary  PainSc: 5    Pain Goal:                   Carmelle Bamberg N

## 2019-12-21 NOTE — Lactation Note (Signed)
This note was copied from a baby's chart. Lactation Consultation Note  Patient Name: Brittney Olson PVXYI'[id number] Date: 12/21/2019 Reason for consult: Follow-up assessment;Term  P2 mother whose infant is now 25 hours old.  Mother breast fed her first child.  Baby was swaddled and awakening in the bassinet when I arrived.  Offered to assist with latching and mother accepted.  Mother feels like baby is latching well.  Her breasts are soft and non tender and nipples are everted and intact.  The nipples are slightly irritated and it may be due to baby not obtaining a deep latch.  Mother verified that sometimes she is on the nipple only.  Education provided on the importance of obtaining and maintaining a deep latch.  Asked permission to try the cross cradle hold and mother willing to try.  Assisted baby to latch, however, once at the breast she became irritated and did not initiate sucking.  Another attempt made with the same results.  Mother hand expressed colostrum drops which I finger fed back to baby.  Suggested we try football hold.  Again, she latched easily but was not interested in sucking.  Suggested mother place her STS and observe for feeding cues.  Baby calm when removed from the breast and fell back to sleep.  Encouraged using EBM/coconut oil to nipples/areolas for comfort.  Mother verbalized understanding.  Mother needed reminders to keep fingers away from the nipple/areola when latching; also asked her not to make an air hole by baby's nose for breathing.  Asked her to call her RN/LC for further assistance once baby shows readiness.  Mother has a DEBP for home use.  Father present.   Maternal Data Formula Feeding for Exclusion: Yes Reason for exclusion: Mother's choice to formula and breast feed on admission Has patient been taught Hand Expression?: Yes Does the patient have breastfeeding experience prior to this delivery?: Yes  Feeding Feeding Type: Breast Fed  LATCH Score Latch:  Repeated attempts needed to sustain latch, nipple held in mouth throughout feeding, stimulation needed to elicit sucking reflex.  Audible Swallowing: None  Type of Nipple: Everted at rest and after stimulation  Comfort (Breast/Nipple): Soft / non-tender  Hold (Positioning): Assistance needed to correctly position infant at breast and maintain latch.  LATCH Score: 6  Interventions Interventions: Breast feeding basics reviewed;Assisted with latch;Skin to skin;Breast massage;Hand express;Breast compression;Adjust position;Coconut oil;Position options;Support pillows  Lactation Tools Discussed/Used     Consult Status Consult Status: Follow-up Date: 12/22/19 Follow-up type: In-patient    Elhadj Girton R Sidhant Helderman 12/21/2019, 8:29 AM

## 2019-12-22 ENCOUNTER — Other Ambulatory Visit: Payer: Self-pay

## 2019-12-22 MED ORDER — IBUPROFEN 600 MG PO TABS: 600.0000 mg | ORAL_TABLET | Freq: Four times a day (QID) | ORAL | 0 refills | Status: DC | PRN

## 2019-12-22 NOTE — Discharge Summary (Signed)
OB Discharge Summary     Patient Name: Brittney Olson DOB: 01-04-1991 MRN: [id number]  Date of admission: 12/20/2019 Delivering MD: Gerald Leitz   Date of discharge: 12/22/2019  Admitting diagnosis: Post-dates pregnancy [O48.0] Intrauterine pregnancy: [redacted]w[redacted]d     Secondary diagnosis:  Active Problems:   Post-dates pregnancy  Additional problems: None     Discharge diagnosis: Term Pregnancy Delivered                                                                                                Post partum procedures:None  Augmentation: Pitocin and Cytotec  Complications: None  Hospital course:  Induction of Labor With Vaginal Delivery   28 y.o. yo [id number] at [redacted]w[redacted]d was admitted to the hospital 12/20/2019 for induction of labor.  Indication for induction: Postdates.  Patient had an uncomplicated labor course as follows: Membrane Rupture Time/Date: 10:30 AM ,12/20/2019   Intrapartum Procedures: Episiotomy: None [1]                                         Lacerations:  1st degree [2];Perineal [11]  Patient had delivery of a Viable infant.  Information for the patient's newborn:  Ambrosia, Wisnewski [[id number]]  Delivery Method: Vaginal, Spontaneous(Filed from Delivery Summary)    12/20/2019  Details of delivery can be found in separate delivery note.  Patient had a routine postpartum course. Patient is discharged home 12/22/19.  Physical exam  Vitals:   12/21/19 0625 12/21/19 1402 12/21/19 2235 12/22/19 0553  BP: 116/75 100/66 113/73 (!) 96/53  Pulse: 61 67 64 64  Resp: 18 18 17 18   Temp: 98 F (36.7 C) 98.3 F (36.8 C) 98.4 F (36.9 C) 98 F (36.7 C)  TempSrc: Axillary Oral Oral Oral  SpO2: 99%  100%   Weight:      Height:       General: alert, cooperative and no distress Lochia: appropriate Uterine Fundus: firm Incision: N/A DVT Evaluation: No evidence of DVT seen on physical exam. Labs: Lab Results  Component Value Date   WBC 11.7 (H) 12/21/2019   HGB 9.5 (L)  12/21/2019   HCT 30.4 (L) 12/21/2019   MCV 76.2 (L) 12/21/2019   PLT 264 12/21/2019   CMP Latest Ref Rng & Units 11/25/2019  Glucose 70 - 99 mg/dL 14/02/2019)  BUN 6 - 20 mg/dL 6  Creatinine 503(T - 4.65 mg/dL 6.81  Sodium 2.75 - 170 mmol/L 136  Potassium 3.5 - 5.1 mmol/L 3.5  Chloride 98 - 111 mmol/L 106  CO2 22 - 32 mmol/L 22  Calcium 8.9 - 10.3 mg/dL 017)  Total Protein 6.5 - 8.1 g/dL 6.2(L)  Total Bilirubin 0.3 - 1.2 mg/dL 0.3  Alkaline Phos 38 - 126 U/L 177(H)  AST 15 - 41 U/L 19  ALT 0 - 44 U/L 11    Discharge instruction: per After Visit Summary and "Baby and Me Booklet".  After visit meds:  Allergies as of 12/22/2019   No Known Allergies  Medication List    TAKE these medications   ibuprofen 600 MG tablet Commonly known as: ADVIL Take 1 tablet (600 mg total) by mouth every 6 (six) hours as needed.   prenatal multivitamin Tabs tablet Take 1 tablet by mouth daily at 12 noon.       Diet: routine diet  Activity: Advance as tolerated. Pelvic rest for 6 weeks.   Outpatient follow up:2 weeks Follow up Appt:No future appointments. Follow up Visit:No follow-ups on file.  Postpartum contraception: Abstinence  Newborn Data: Live born female  Birth Weight: 6 lb 13.2 oz (3095 g) APGAR: 9, 9  Newborn Delivery   Birth date/time: 12/20/2019 11:16:00 Delivery type: Vaginal, Spontaneous      Baby Feeding: Breast Disposition:home with mother   12/22/2019 Christophe Louis, MD

## 2020-12-23 NOTE — L&D Delivery Note (Signed)
Delivery Note Labor onset:   Labor Onset Time: 2250 Complete dilation at 11:44 PM  Onset of pushing at 2344 FHR second stage Cat 2 Analgesia/Anesthesia intrapartum: N/A  Guided pushing with maternal urge. Delivery of a viable female at 2348. Fetal head delivered in ROA position.  Nuchal cord: x1 reduced.  Infant placed on maternal abd, dried, and tactile stim. Spontaneous cry. Cord double clamped after pulsation ceased and cut by FOB.  2 RNS present for birth.  Cord blood sample collected: yes Arterial cord blood sample collected: N/A  Placenta delivered Brittney Olson via brandt andrews maneuver, intact, with 3 VC.  Placenta to L&D. Uterine tone firm, bleeding minimal  No laceration identified.  Anesthesia: N/A Repair: N/A QBL/EBL (mL): 150 Complications: N/A APGAR: APGAR (1 MIN): 8   APGAR (5 MINS): 9   APGAR (10 MINS):   Mom to postpartum.  Baby to Couplet care / Skin to Skin.  Brittney Olson Brittney Kuchera MSN, CNM 12/12/2021, 12:43 AM

## 2021-04-30 LAB — OB RESULTS CONSOLE HIV ANTIBODY (ROUTINE TESTING): HIV: NONREACTIVE

## 2021-04-30 LAB — OB RESULTS CONSOLE RUBELLA ANTIBODY, IGM
Rubella: IMMUNE
Rubella: IMMUNE

## 2021-04-30 LAB — OB RESULTS CONSOLE RPR: RPR: NONREACTIVE

## 2021-04-30 LAB — OB RESULTS CONSOLE HEPATITIS B SURFACE ANTIGEN: Hepatitis B Surface Ag: NEGATIVE

## 2021-05-16 LAB — OB RESULTS CONSOLE GC/CHLAMYDIA
Chlamydia: NEGATIVE
Gonorrhea: NEGATIVE

## 2021-11-14 ENCOUNTER — Inpatient Hospital Stay (HOSPITAL_COMMUNITY)
Admission: AD | Admit: 2021-11-14 | Discharge: 2021-11-14 | Disposition: A | Discharge status: Home / Self Care | Payer: Medicaid Other | Attending: Obstetrics and Gynecology | Admitting: Obstetrics and Gynecology

## 2021-11-14 ENCOUNTER — Other Ambulatory Visit: Payer: Self-pay

## 2021-11-14 ENCOUNTER — Encounter (HOSPITAL_COMMUNITY): Payer: Self-pay | Admitting: Obstetrics and Gynecology

## 2021-11-14 ENCOUNTER — Inpatient Hospital Stay (HOSPITAL_BASED_OUTPATIENT_CLINIC_OR_DEPARTMENT_OTHER): Payer: Medicaid Other

## 2021-11-14 DIAGNOSIS — O368130 Decreased fetal movements, third trimester, not applicable or unspecified: Secondary | ICD-10-CM | POA: Diagnosis present

## 2021-11-14 DIAGNOSIS — O368190 Decreased fetal movements, unspecified trimester, not applicable or unspecified: Secondary | ICD-10-CM

## 2021-11-14 DIAGNOSIS — Z3A35 35 weeks gestation of pregnancy: Secondary | ICD-10-CM | POA: Insufficient documentation

## 2021-11-14 DIAGNOSIS — Z87891 Personal history of nicotine dependence: Secondary | ICD-10-CM | POA: Insufficient documentation

## 2021-11-14 DIAGNOSIS — Z3689 Encounter for other specified antenatal screening: Secondary | ICD-10-CM | POA: Diagnosis not present

## 2021-11-14 DIAGNOSIS — O4703 False labor before 37 completed weeks of gestation, third trimester: Secondary | ICD-10-CM | POA: Insufficient documentation

## 2021-11-14 HISTORY — DX: Depression, unspecified: F32.A

## 2021-11-14 LAB — URINALYSIS, ROUTINE W REFLEX MICROSCOPIC
Bilirubin Urine: NEGATIVE
Glucose, UA: NEGATIVE mg/dL
Hgb urine dipstick: NEGATIVE
Ketones, ur: NEGATIVE mg/dL
Nitrite: NEGATIVE
Protein, ur: NEGATIVE mg/dL
Specific Gravity, Urine: 1.006 (ref 1.005–1.030)
pH: 7 (ref 5.0–8.0)

## 2021-11-14 NOTE — MAU Provider Note (Addendum)
History     CSN: [id number]  Arrival date and time: 11/14/21 1232   Event Date/Time   First Provider Initiated Contact with Patient 11/14/21 1333      Chief Complaint  Patient presents with   Decreased Fetal Movement   HPI [facility] 30 y.o. [redacted]w[redacted]d Comes to MAU today with a distinct change in fetal movements - much softer and infrequent - since Monday night.  Patient had a good breakfast but has not eaten since then.  Has not felt movement today at home.  Has had some contractions but is not having pain with contractions.  Patient of [facility].   OB History     Gravida  3   Para  2   Term  2   Preterm      AB      Living  2      SAB      IAB      Ectopic      Multiple  0   Live Births  2           Past Medical History:  Diagnosis Date   Anemia    Depression     Past Surgical History:  Procedure Laterality Date   NO PAST SURGERIES      Family History  Problem Relation Age of Onset   Cancer Maternal Grandfather    Lupus Maternal Aunt    Kidney disease Maternal Uncle    Hypertension Maternal Grandmother     Social History   Tobacco Use   Smoking status: Former    Types: Cigarettes    Quit date: 10/02/2013    Years since quitting: 8.1   Smokeless tobacco: Never   Tobacco comments:    smoked socially   Vaping Use   Vaping Use: Never used  Substance Use Topics   Alcohol use: No   Drug use: No    Allergies: No Known Allergies  Medications Prior to Admission  Medication Sig Dispense Refill Last Dose   Prenatal Vit-Fe Fumarate-FA (PRENATAL MULTIVITAMIN) TABS tablet Take 1 tablet by mouth daily at 12 noon.   11/13/2021   sertraline (ZOLOFT) 25 MG tablet Take 25 mg by mouth daily.   11/13/2021   ibuprofen (ADVIL) 600 MG tablet Take 1 tablet (600 mg total) by mouth every 6 (six) hours as needed. 30 tablet 0     Review of Systems  Constitutional:  Negative for fever.  Respiratory:  Negative for cough, shortness of breath and  wheezing.   Gastrointestinal:  Negative for abdominal pain, diarrhea, nausea and vomiting.       Decreased fetal movement  Genitourinary:  Negative for dysuria, vaginal bleeding and vaginal discharge.   Physical Exam   Blood pressure 101/66, pulse 78, temperature 98.1 F (36.7 C), temperature source Oral, resp. rate 15, SpO2 100 %, unknown if currently breastfeeding.  Physical Exam Vitals and nursing note reviewed.  Constitutional:      Appearance: She is well-developed.  HENT:     Head: Normocephalic.  Cardiovascular:     Rate and Rhythm: Regular rhythm.  Pulmonary:     Effort: Pulmonary effort is normal.  Abdominal:     Palpations: Abdomen is soft.     Tenderness: There is no abdominal tenderness. There is no guarding or rebound.     Comments: Has not felt fetal movements here at the hospital on the fetal monitor with the clicker to track fetal movements. Occasional contractions noted.  Musculoskeletal:  General: Normal range of motion.     Cervical back: Neck supple.  Skin:    General: Skin is warm and dry.  Neurological:     Mental Status: She is alert and oriented to person, place, and time.  Psychiatric:        Mood and Affect: Mood normal.        Behavior: Behavior normal.        Thought Content: Thought content normal.   NST - FHT 130 with moderate variability No decelerations Accels of at least 15x15 noted Irregular contractions. Category 1 tracing  MAU Course  Procedures  MDM Has eaten approx 4 hours ago.  Reports she has been drinking water well.  Will observe on monitor, but since decreased fetal movement has been since Monday, will get BPP today. BPP 8/8   Irregular contractions that are not painful - cervix 1 cm, 50%, vertex -3 station - not in labor  Patient reports she started taking Zoloft on Monday and then she noticed the baby's movements were softer.  Explained she should continue to take her medication as ordered.  Patient was reassured  by [place] today that baby is moving appropriately.  Assessment and Plan  Decreased fetal movement - baby moving well in MAU with accels and BPP 8/8  Plan Keep appointments as scheduled in the office. Baby is moving well after having juice in MAU. Be sure to check for movements after meals.   Continue medication as prescribed. Baby may be in a sleep cycle periodically.  Expect to feel movements now as you have been doing this week. Return to MAU if the baby is not moving well.  Brittney Olson L Brittney Olson 11/14/2021, 1:39 PM

## 2021-11-14 NOTE — MAU Note (Addendum)
.  Brittney Olson is a 30 y.o. at [redacted]w[redacted]d here in MAU reporting: decreased fetal movement today, last felt baby move an hour ago. States there are 6-7 movements an hour but they are "softer" than normal. Denies VB or LOF.   Pain score: 0  FHT:156

## 2021-11-14 NOTE — Discharge Instructions (Signed)
Keep appointments as scheduled in the office. Baby is moving well after having juice in MAU. Be sure to check for movements after meals.   Baby may be in a sleep cycle periodically.  Expect to feel movements now as you have been doing this week. Return to MAU if the baby is not moving well.

## 2021-11-20 LAB — OB RESULTS CONSOLE GBS: GBS: NEGATIVE

## 2021-12-11 ENCOUNTER — Inpatient Hospital Stay (HOSPITAL_COMMUNITY)
Admission: AD | Admit: 2021-12-11 | Discharge: 2021-12-13 | DRG: 807 | Disposition: A | Discharge status: Home / Self Care | Payer: Medicaid Other | Attending: Obstetrics and Gynecology | Admitting: Obstetrics and Gynecology

## 2021-12-11 ENCOUNTER — Other Ambulatory Visit: Payer: Self-pay

## 2021-12-11 DIAGNOSIS — Z87891 Personal history of nicotine dependence: Secondary | ICD-10-CM

## 2021-12-11 DIAGNOSIS — Z20822 Contact with and (suspected) exposure to covid-19: Secondary | ICD-10-CM | POA: Diagnosis present

## 2021-12-11 DIAGNOSIS — Z3A39 39 weeks gestation of pregnancy: Secondary | ICD-10-CM

## 2021-12-11 DIAGNOSIS — F419 Anxiety disorder, unspecified: Secondary | ICD-10-CM | POA: Diagnosis present

## 2021-12-11 DIAGNOSIS — F32A Depression, unspecified: Secondary | ICD-10-CM | POA: Diagnosis present

## 2021-12-11 DIAGNOSIS — O26893 Other specified pregnancy related conditions, third trimester: Secondary | ICD-10-CM | POA: Diagnosis present

## 2021-12-11 DIAGNOSIS — O99344 Other mental disorders complicating childbirth: Secondary | ICD-10-CM | POA: Diagnosis present

## 2021-12-11 LAB — CBC
HCT: 38.3 % (ref 36.0–46.0)
Hemoglobin: 11.8 g/dL — ABNORMAL LOW (ref 12.0–15.0)
MCH: 23.3 pg — ABNORMAL LOW (ref 26.0–34.0)
MCHC: 30.8 g/dL (ref 30.0–36.0)
MCV: 75.7 fL — ABNORMAL LOW (ref 80.0–100.0)
Platelets: 371 10*3/uL (ref 150–400)
RBC: 5.06 MIL/uL (ref 3.87–5.11)
RDW: 14.8 % (ref 11.5–15.5)
WBC: 12.2 10*3/uL — ABNORMAL HIGH (ref 4.0–10.5)
nRBC: 0 % (ref 0.0–0.2)

## 2021-12-11 MED ORDER — OXYTOCIN-SODIUM CHLORIDE 30-0.9 UT/500ML-% IV SOLN: 2.5000 [IU]/h | INTRAVENOUS | Status: DC

## 2021-12-11 MED ORDER — OXYCODONE-ACETAMINOPHEN 5-325 MG PO TABS: 1.0000 | ORAL_TABLET | ORAL | Status: DC | PRN

## 2021-12-11 MED ORDER — OXYTOCIN-SODIUM CHLORIDE 30-0.9 UT/500ML-% IV SOLN
INTRAVENOUS | Status: AC
  Filled 2021-12-11: qty 500

## 2021-12-11 MED ORDER — OXYTOCIN BOLUS FROM INFUSION
333.0000 mL | Freq: Once | INTRAVENOUS | Status: AC
  Administered 2021-12-11: 333 mL via INTRAVENOUS

## 2021-12-11 MED ORDER — DIPHENHYDRAMINE HCL 50 MG/ML IJ SOLN: 12.5000 mg | INTRAMUSCULAR | Status: DC | PRN

## 2021-12-11 MED ORDER — OXYCODONE-ACETAMINOPHEN 5-325 MG PO TABS: 2.0000 | ORAL_TABLET | ORAL | Status: DC | PRN

## 2021-12-11 MED ORDER — PHENYLEPHRINE 40 MCG/ML (10ML) SYRINGE FOR IV PUSH (FOR BLOOD PRESSURE SUPPORT): 80.0000 ug | PREFILLED_SYRINGE | INTRAVENOUS | Status: DC | PRN

## 2021-12-11 MED ORDER — LACTATED RINGERS IV SOLN: 500.0000 mL | Freq: Once | INTRAVENOUS | Status: DC

## 2021-12-11 MED ORDER — LACTATED RINGERS IV SOLN: INTRAVENOUS | Status: DC

## 2021-12-11 MED ORDER — SOD CITRATE-CITRIC ACID 500-334 MG/5ML PO SOLN: 30.0000 mL | ORAL | Status: DC | PRN

## 2021-12-11 MED ORDER — LIDOCAINE HCL (PF) 1 % IJ SOLN: 30.0000 mL | INTRAMUSCULAR | Status: DC | PRN

## 2021-12-11 MED ORDER — ONDANSETRON HCL 4 MG/2ML IJ SOLN: 4.0000 mg | Freq: Four times a day (QID) | INTRAMUSCULAR | Status: DC | PRN

## 2021-12-11 MED ORDER — EPHEDRINE 5 MG/ML INJ: 10.0000 mg | INTRAVENOUS | Status: DC | PRN

## 2021-12-11 MED ORDER — ACETAMINOPHEN 325 MG PO TABS: 650.0000 mg | ORAL_TABLET | ORAL | Status: DC | PRN

## 2021-12-11 MED ORDER — LACTATED RINGERS IV SOLN: 500.0000 mL | INTRAVENOUS | Status: DC | PRN

## 2021-12-11 MED ORDER — FENTANYL-BUPIVACAINE-NACL 0.5-0.125-0.9 MG/250ML-% EP SOLN: 12.0000 mL/h | EPIDURAL | Status: DC | PRN

## 2021-12-11 NOTE — H&P (Signed)
OB ADMISSION/ HISTORY & PHYSICAL:  Admission Date: 12/11/2021 11:01 PM  Admit Diagnosis: Normal labor  Brittney Olson is a 30 y.o. female [id number] [redacted]w[redacted]d presenting for labor with SROM @ 2250. Endorses active FM. Normal show present.  Ctx began @ 2250  History of current pregnancy: [id number]   Primary OB Provider: Deboraha Sprang Patient entered care with [facility] at 9.4 wks.   EDC 12/15/21 by LMP03/19/22 and congruent w/ early U/S.   Anatomy scan:  18.6 wks, complete  Significant prenatal events:  Depression with anxiety Patient Active Problem List   Diagnosis Date Noted   Post-dates pregnancy 12/20/2019   Indication for care in labor or delivery 12/12/2017    Prenatal Labs: ABO, Rh:  B Positive Antibody:  Negative Rubella:   Immune RPR:   NR HBsAg:   NR HIV:   NR GTT: 153, 3 hr gtt WNL GBS:   Negative GC/CHL: negative/negative Genetics: low risk Tdap/influenza vaccines: UTD Tdap, unsure flu   OB History  Gravida Para Term Preterm AB Living  3 2 2     2   SAB IAB Ectopic Multiple Live Births        0 2    # Outcome Date GA Lbr Len/2nd Weight Sex Delivery Anes PTL Lv  3 Current           2 Term 12/20/19 [redacted]w[redacted]d 00:28 / 00:18 3095 g F Vag-Spont EPI  LIV  1 Term 12/12/17 [redacted]w[redacted]d 11:42 / 01:40 3020 g F Vag-Spont EPI  LIV    Medical / Surgical History: Past medical history:  Past Medical History:  Diagnosis Date   Anemia    Depression     Past surgical history:  Past Surgical History:  Procedure Laterality Date   NO PAST SURGERIES     Family History:  Family History  Problem Relation Age of Onset   Cancer Maternal Grandfather    Lupus Maternal Aunt    Kidney disease Maternal Uncle    Hypertension Maternal Grandmother     Social History:  reports that she quit smoking about 8 years ago. Her smoking use included cigarettes. She has never used smokeless tobacco. She reports that she does not drink alcohol and does not use drugs.  Allergies: Patient has no known allergies.    Current Medications at time of admission:  Prior to Admission medications   Medication Sig Start Date End Date Taking? Authorizing Provider  Prenatal Vit-Fe Fumarate-FA (PRENATAL MULTIVITAMIN) TABS tablet Take 1 tablet by mouth daily at 12 noon.    [provider]  sertraline (ZOLOFT) 25 MG tablet Take 25 mg by mouth daily.    [provider]    Review of Systems: Constitutional: Negative   HENT: Negative   Eyes: Negative   Respiratory: Negative   Cardiovascular: Negative   Gastrointestinal: Negative  Genitourinary: positive for bloody show, positive for LOF   Musculoskeletal: Negative   Skin: Negative   Neurological: Negative   Endo/Heme/Allergies: Negative   Psychiatric/Behavioral: Negative    Physical Exam: VS: Blood pressure 126/87, pulse 79, temperature 97.9 F (36.6 C), temperature source Oral, resp. rate 20, height 5' (1.524 m), weight 64.3 kg, unknown if currently breastfeeding. AAO x3, no signs of distress Cardiovascular: RRR Respiratory: Lung fields clear to ausculation GU/GI: Abdomen gravid, non-tender, non-distended, active FM, vertex, EFW 7 per Leopold's Extremities: negative edema, negative for pain, tenderness, and cords  Cervical exam:  FHR: baseline rate 120 / variability moderate / accelerations present / early decelerations TOCO:  1-3  Prenatal Transfer Tool  Maternal Diabetes: No Genetic Screening: Normal Maternal Ultrasounds/Referrals: Normal Fetal Ultrasounds or other Referrals:  None Maternal Substance Abuse:  No Significant Maternal Medications:  None Significant Maternal Lab Results: Group B Strep negative    Assessment: 30 y.o. [id number] [redacted]w[redacted]d spontaneous labor  Active stage of labor FHR category 1 GBS negative Pain management plan: epidural   Plan:  Admit to L&D Routine admission orders Epidural PRN   Dr Su Hilt notified of admission and plan of care  Carollee Leitz MSN, CNM 12/11/2021 11:35 PM

## 2021-12-11 NOTE — MAU Note (Signed)
PT SAYS SROM AT 1050PM UC STRONG SINCE SROM PNC - WITH [facility]  VE YESTERDAY- 2 CM GBS- NEG DENIES HSV

## 2021-12-12 ENCOUNTER — Encounter (HOSPITAL_COMMUNITY): Payer: Self-pay | Admitting: Obstetrics and Gynecology

## 2021-12-12 LAB — CBC
HCT: 29.3 % — ABNORMAL LOW (ref 36.0–46.0)
Hemoglobin: 9.2 g/dL — ABNORMAL LOW (ref 12.0–15.0)
MCH: 23.4 pg — ABNORMAL LOW (ref 26.0–34.0)
MCHC: 31.4 g/dL (ref 30.0–36.0)
MCV: 74.6 fL — ABNORMAL LOW (ref 80.0–100.0)
Platelets: 283 10*3/uL (ref 150–400)
RBC: 3.93 MIL/uL (ref 3.87–5.11)
RDW: 14.6 % (ref 11.5–15.5)
WBC: 15 10*3/uL — ABNORMAL HIGH (ref 4.0–10.5)
nRBC: 0 % (ref 0.0–0.2)

## 2021-12-12 LAB — TYPE AND SCREEN
ABO/RH(D): B POS
Antibody Screen: NEGATIVE

## 2021-12-12 LAB — RESP PANEL BY RT-PCR (FLU A&B, COVID) ARPGX2
Influenza A by PCR: NEGATIVE
Influenza B by PCR: NEGATIVE
SARS Coronavirus 2 by RT PCR: NEGATIVE

## 2021-12-12 LAB — RPR: RPR Ser Ql: NONREACTIVE

## 2021-12-12 LAB — HIV ANTIBODY (ROUTINE TESTING W REFLEX): HIV Screen 4th Generation wRfx: NONREACTIVE

## 2021-12-12 MED ORDER — ACETAMINOPHEN 325 MG PO TABS
650.0000 mg | ORAL_TABLET | ORAL | Status: AC | PRN
Start: 2021-12-12 — End: ?

## 2021-12-12 MED ORDER — WITCH HAZEL-GLYCERIN EX PADS: 1.0000 "application " | MEDICATED_PAD | CUTANEOUS | Status: DC | PRN

## 2021-12-12 MED ORDER — ONDANSETRON HCL 4 MG/2ML IJ SOLN: 4.0000 mg | INTRAMUSCULAR | Status: DC | PRN

## 2021-12-12 MED ORDER — BENZOCAINE-MENTHOL 20-0.5 % EX AERO
1.0000 "application " | INHALATION_SPRAY | CUTANEOUS | Status: DC | PRN
  Administered 2021-12-12: 1 via TOPICAL
  Filled 2021-12-12: qty 56

## 2021-12-12 MED ORDER — DIBUCAINE (PERIANAL) 1 % EX OINT: 1.0000 "application " | TOPICAL_OINTMENT | CUTANEOUS | Status: DC | PRN

## 2021-12-12 MED ORDER — ONDANSETRON HCL 4 MG PO TABS: 4.0000 mg | ORAL_TABLET | ORAL | Status: DC | PRN

## 2021-12-12 MED ORDER — SENNOSIDES-DOCUSATE SODIUM 8.6-50 MG PO TABS: 2.0000 | ORAL_TABLET | Freq: Every day | ORAL | Status: DC

## 2021-12-12 MED ORDER — ZOLPIDEM TARTRATE 5 MG PO TABS: 5.0000 mg | ORAL_TABLET | Freq: Every evening | ORAL | Status: DC | PRN

## 2021-12-12 MED ORDER — ACETAMINOPHEN 325 MG PO TABS: 650.0000 mg | ORAL_TABLET | ORAL | Status: DC | PRN

## 2021-12-12 MED ORDER — SIMETHICONE 80 MG PO CHEW: 80.0000 mg | CHEWABLE_TABLET | ORAL | Status: DC | PRN

## 2021-12-12 MED ORDER — DIPHENHYDRAMINE HCL 25 MG PO CAPS: 25.0000 mg | ORAL_CAPSULE | Freq: Four times a day (QID) | ORAL | Status: DC | PRN

## 2021-12-12 MED ORDER — COCONUT OIL OIL
1.0000 "application " | TOPICAL_OIL | Status: DC | PRN
  Administered 2021-12-12: 1 via TOPICAL

## 2021-12-12 MED ORDER — SERTRALINE HCL 50 MG PO TABS
50.0000 mg | ORAL_TABLET | Freq: Every day | ORAL | 6 refills | Status: AC
Start: 2021-12-13 — End: ?

## 2021-12-12 MED ORDER — PRENATAL MULTIVITAMIN CH
1.0000 | ORAL_TABLET | Freq: Every day | ORAL | Status: DC
  Administered 2021-12-12: 11:00:00 1 via ORAL
  Filled 2021-12-12: qty 1

## 2021-12-12 MED ORDER — IBUPROFEN 600 MG PO TABS
600.0000 mg | ORAL_TABLET | Freq: Four times a day (QID) | ORAL | Status: DC
  Administered 2021-12-12 – 2021-12-13 (×6): 600 mg via ORAL
  Filled 2021-12-12 (×6): qty 1

## 2021-12-12 MED ORDER — TETANUS-DIPHTH-ACELL PERTUSSIS 5-2.5-18.5 LF-MCG/0.5 IM SUSY: 0.5000 mL | PREFILLED_SYRINGE | Freq: Once | INTRAMUSCULAR | Status: DC

## 2021-12-12 MED ORDER — IBUPROFEN 600 MG PO TABS: 600.0000 mg | ORAL_TABLET | Freq: Four times a day (QID) | ORAL | 0 refills | Status: AC | PRN

## 2021-12-12 MED ORDER — SERTRALINE HCL 50 MG PO TABS
50.0000 mg | ORAL_TABLET | Freq: Every day | ORAL | Status: DC
  Administered 2021-12-12: 08:00:00 50 mg via ORAL
  Filled 2021-12-12: qty 1

## 2021-12-12 NOTE — Social Work (Signed)
MOB was referred for history of depression and anxiety.   * Referral screened out by Clinical Social Worker because none of the following criteria appear to apply:  ~ History of anxiety/depression during this pregnancy, or of post-partum depression following prior delivery. ~ Diagnosis of anxiety and/or depression within last 3 years. OR * MOB's symptoms currently being treated with medication and/or therapy. CSW reviewed chart and notes MOB prescribed Zoloft 50mg .   Please contact the Clinical Social Worker if needs arise, by [facility] request, or if MOB scores greater than 9/yes to question 10 on [place] Postpartum Depression Screen.  02-28-1978, LCSWA Clinical Social Work [facility] [employer]  [phone]

## 2021-12-12 NOTE — Lactation Note (Signed)
This note was copied from a baby's chart. Lactation Consultation Note Declines Lactation Patient Name: Brittney Olson Today's Date: 12/12/2021   Age:30 hours  Maternal Data    Feeding    LATCH Score                    Lactation Tools Discussed/Used    Interventions    Discharge    Consult Status      Brittney Olson 12/12/2021, 1:45 AM

## 2021-12-12 NOTE — Progress Notes (Signed)
Post Partum Day 1 SVD  Subjective: no complaints, up ad lib, voiding, tolerating PO, and + flatus  Objective: Blood pressure 105/70, pulse 67, temperature 98.9 F (37.2 C), temperature source Oral, resp. rate 20, height 5' (1.524 m), weight 64.3 kg, SpO2 99 %, unknown if currently breastfeeding.  Physical Exam:  General: alert, cooperative, and no distress Lochia: appropriate Uterine Fundus: firm Incision: NA DVT Evaluation: No evidence of DVT seen on physical exam.  Recent Labs    12/11/21 2335 12/12/21 0702  HGB 11.8* 9.2*  HCT 38.3 29.3*    Assessment/Plan: Plan for discharge tomorrow and Breastfeeding  Routine postpartum care    LOS: 1 day   Brittney Olson 12/12/2021, 3:55 PM

## 2021-12-13 NOTE — Social Work (Signed)
CSW received consult for hx of [place] 10. CSW met with MOB to offer support and complete assessment.    CSW met with MOB at bedside and introduced CSW role. CSW observed MOB breastfeeding the infant and FOB present at bedside. CSW offered MOB privacy, MOB presented calm and welcomed CSW visit with FOB present. CSW inquired how MOB has felt since giving birth. MOB reported feeling good with a good L&D experience. CSW discussed MOB Flavia Shipper and inquired how she felt emotionally during the pregnancy. MOB reported having symptoms of anxiety and depression. MOB reported she feels like she has always had anxiety and managed well until about four years ago with her first pregnancy. MOB reported she was diagnosed with depression and anxiety at the time. MOB shared she also experienced PPD and was prescribed medication and saw a therapist. MOB shared she currently treats her symptoms with Zoloft and plans to continue for the next six months. MOB reported feeling sad, miserable, and alone during the pregnancy because her spouse was at work. She has two small children 2 and 4 that she cares for. MOB reported that since her spouse is home now she feels more comfortable because she has help with the children and can take time for herself. MOB identified FOB and her parents as supports. MOB was receptive to education regarding the baby blues period vs. perinatal mood disorders. CSW discussed treatment and gave resources for mental health follow up if concerns arise.  CSW recommends self-evaluation during the postpartum time period using the New Mom Checklist from Postpartum Progress and encouraged MOB to contact a medical professional if symptoms are noted at any time. MOB reported she feels comfortable reaching out to her doctor if she has concerns. MOB denied SI/HI.    CSW provided review of Sudden Infant Death Syndrome (SIDS) precautions.  MOB reported she has essential items for the infant including a bassinet and  crib where the infant will sleep. MOB has chosen [facility] for the infant's follow up care. CSW assessed MOB for additional need. MOB reported no further needs.   CSW identifies no further need for intervention and no barriers to discharge at this time.    Kathrin Greathouse, MSW, LCSW [facility]'s and [employer] Worker  [phone] 12/13/2021  10:27 AM

## 2021-12-13 NOTE — Discharge Summary (Signed)
Postpartum Discharge Summary  Date of Service: 12/13/21      Patient Name: Brittney Olson DOB: [birth year] MRN: [id number]  Date of admission: 12/11/2021 Delivery date:12/11/2021  Delivering provider: Holli Humbles B  Date of discharge: 12/13/2021  Admitting diagnosis: Normal labor and delivery [O80] Intrauterine pregnancy: [redacted]w[redacted]d    Secondary diagnosis:  Principal Problem:   Normal labor and delivery  Additional problems: Anxiety/depression    Discharge diagnosis: Term Pregnancy Delivered                                              Post partum procedures: None Augmentation: N/A Complications: None  Hospital course: Onset of Labor With Vaginal Delivery      30y.o. yo [id number] 30w3das admitted in Active Labor on 12/11/2021. Patient had an uncomplicated labor course as follows:  Membrane Rupture Time/Date: 10:50 PM ,12/11/2021   Delivery Method:Vaginal, Spontaneous  Episiotomy: None  Lacerations:  None  Patient had an uncomplicated postpartum course.  She is ambulating, tolerating a regular diet, passing flatus, and urinating well. Patient is discharged home in stable condition on 12/13/21.  Newborn Data: Birth date:12/11/2021  Birth time:11:48 PM  Gender:Female  Living status:Living  Apgars:8 ,9  Weight:3290 g   Magnesium Sulfate received: No BMZ received: No Rhophylac:N/A MMR:N/A T-DaP:Given prenatally Transfusion:No  Physical exam  Vitals:   12/12/21 1118 12/12/21 1515 12/12/21 2042 12/13/21 0509  BP: 91/61 105/70 111/74 106/77  Pulse: 84 67 79 75  Resp: '20 20 18 17  ' Temp: 98.7 F (37.1 C) 98.9 F (37.2 C) 97.9 F (36.6 C) 97.7 F (36.5 C)  TempSrc: Oral Oral Oral Oral  SpO2: 99% 99% 99%   Weight:      Height:       General: alert, cooperative, and no distress Lochia: appropriate Uterine Fundus: firm Incision: N/A DVT Evaluation: No evidence of DVT seen on physical exam. No cords or calf tenderness. Labs: Lab Results  Component Value  Date   WBC 15.0 (H) 12/12/2021   HGB 9.2 (L) 12/12/2021   HCT 29.3 (L) 12/12/2021   MCV 74.6 (L) 12/12/2021   PLT 283 12/12/2021   CMP Latest Ref Rng & Units 11/25/2019  Glucose 70 - 99 mg/dL 120(H)  BUN 6 - 20 mg/dL 6  Creatinine 0.44 - 1.00 mg/dL 0.51  Sodium 135 - 145 mmol/L 136  Potassium 3.5 - 5.1 mmol/L 3.5  Chloride 98 - 111 mmol/L 106  CO2 22 - 32 mmol/L 22  Calcium 8.9 - 10.3 mg/dL 8.4(L)  Total Protein 6.5 - 8.1 g/dL 6.2(L)  Total Bilirubin 0.3 - 1.2 mg/dL 0.3  Alkaline Phos 38 - 126 U/L 177(H)  AST 15 - 41 U/L 19  ALT 0 - 44 U/L 11   Edinburgh Score: Edinburgh Postnatal Depression Scale Screening Tool 12/12/2021  I have been able to laugh and see the funny side of things. 2  I have looked forward with enjoyment to things. 2  I have blamed myself unnecessarily when things went wrong. 1  I have been anxious or worried for no good reason. 0  I have felt scared or panicky for no good reason. 0  Things have been getting on top of me. 2  I have been so unhappy that I have had difficulty sleeping. 1  I have felt sad or miserable. 1  I have been so unhappy  that I have been crying. 1  The thought of harming myself has occurred to me. 0  Edinburgh Postnatal Depression Scale Total 10      After visit meds:  Allergies as of 12/13/2021   No Known Allergies      Medication List     TAKE these medications    acetaminophen 325 MG tablet Commonly known as: Tylenol Take 2 tablets (650 mg total) by mouth every 4 (four) hours as needed (for pain scale < 4).   ibuprofen 600 MG tablet Commonly known as: ADVIL Take 1 tablet (600 mg total) by mouth every 6 (six) hours as needed.   prenatal multivitamin Tabs tablet Take 1 tablet by mouth daily at 12 noon.   sertraline 50 MG tablet Commonly known as: ZOLOFT Take 1 tablet (50 mg total) by mouth daily. What changed:  medication strength how much to take         Discharge home in stable condition Infant Feeding:  Breast Infant Disposition:home with mother Discharge instruction: per After Visit Summary and Postpartum booklet. Activity: Advance as tolerated. Pelvic rest for 6 weeks.  Diet: routine diet Anticipated Birth Control:  Will address at 6 week postpartum visit Postpartum Appointment:6 weeks Additional Postpartum F/U: Postpartum Depression checkup Future Appointments:No future appointments. Follow up Visit:  Follow-up Information     Christophe Louis, MD Follow up in 2 week(s).   Specialty: Obstetrics and Gynecology Why: please call to make an appointment in 2 weeks to evaluate for postpartum depression Contact information: [address] [place] [address] [phone]                     12/13/2021 Drema Dallas, DO

## 2021-12-22 ENCOUNTER — Telehealth (HOSPITAL_COMMUNITY): Payer: Self-pay

## 2021-12-22 NOTE — Telephone Encounter (Signed)
"  We are doing very fine, thank god. I'm having some pain. I talked to my OB and they said as long as I'm not having a fever or having trouble with bleeding then ok. It feels like i'm having contractions in my pelvis. It comes and goes, maybe 5 times a day." RN explained uterine involution and postpartum cramping. "Yes, my OB told me that to." RN told patient to remember to empty her bladder every 2-3 hours as a full bladder can make cramping more uncomfortable. RN also told patient that with each baby we have that the cramping can be more painful. RN also explained that when we breastfeed our body released horomones that stimulates our uterus to contract causing that cramping discomfort as well. "My OB prescribed ibuprofen." RN encouraged patient to take ibuprofen as prescribed and to use a heating pad. RN told patient to reach out to her OB-GYN if the pain does not improve or if she is concerned. Patient has no other questions or concerns about her healing.  "Baby sleeps a lot. I have to wake baby up most of the time to eat. Sometimes baby wakes up on her own. Baby is eating well. Baby is really well. She just had her doctors appointment and everything was fine. She sleeps in a bassinet." RN told patient if she has to constantly always wake baby up to feed, baby is lethargic, or if baby is not feeding well to call her pediatrician. RN reviewed ABC's of safe sleep with patient. Patient declines any questions or concerns about baby.  EPDS score is 4.  Marcelino Duster [facility] 12/22/2021,1116

## 2024-11-10 ENCOUNTER — Other Ambulatory Visit: Payer: Self-pay | Admitting: Obstetrics and Gynecology

## 2024-11-10 ENCOUNTER — Other Ambulatory Visit (HOSPITAL_COMMUNITY)
Admission: RE | Admit: 2024-11-10 | Discharge: 2024-11-10 | Disposition: A | Discharge status: Home / Self Care | Source: Ambulatory Visit | Attending: Obstetrics and Gynecology | Admitting: Obstetrics and Gynecology

## 2024-11-10 DIAGNOSIS — Z01419 Encounter for gynecological examination (general) (routine) without abnormal findings: Secondary | ICD-10-CM | POA: Diagnosis present

## 2024-11-15 LAB — CYTOLOGY - PAP
Comment: NEGATIVE
Diagnosis: NEGATIVE
High risk HPV: NEGATIVE
